# Patient Record
Sex: Female | Born: 1965 | Race: White | Hispanic: No | Smoking: Current every day smoker
Health system: Southern US, Community
[De-identification: ages and names within clinical notes are randomized; demographics above are authoritative.]

## PROBLEM LIST (undated history)

## (undated) DIAGNOSIS — F419 Anxiety disorder, unspecified: Secondary | ICD-10-CM

## (undated) DIAGNOSIS — F319 Bipolar disorder, unspecified: Secondary | ICD-10-CM

## (undated) DIAGNOSIS — M1990 Unspecified osteoarthritis, unspecified site: Secondary | ICD-10-CM

## (undated) DIAGNOSIS — C801 Malignant (primary) neoplasm, unspecified: Secondary | ICD-10-CM

## (undated) DIAGNOSIS — F603 Borderline personality disorder: Secondary | ICD-10-CM

## (undated) HISTORY — PX: OVARIAN CYST REMOVAL: SHX89

## (undated) HISTORY — PX: TONSILLECTOMY: SUR1361

## (undated) HISTORY — PX: ABDOMINAL HYSTERECTOMY: SHX81

---

## 1998-04-21 ENCOUNTER — Ambulatory Visit (HOSPITAL_BASED_OUTPATIENT_CLINIC_OR_DEPARTMENT_OTHER): Admission: RE | Admit: 1998-04-21 | Discharge: 1998-04-21 | Payer: Self-pay | Admitting: Dentistry

## 2000-06-09 ENCOUNTER — Emergency Department (HOSPITAL_COMMUNITY): Admission: EM | Admit: 2000-06-09 | Discharge: 2000-06-09 | Payer: Self-pay | Admitting: Emergency Medicine

## 2000-06-09 ENCOUNTER — Encounter: Payer: Self-pay | Admitting: Emergency Medicine

## 2000-10-30 ENCOUNTER — Other Ambulatory Visit: Admission: RE | Admit: 2000-10-30 | Discharge: 2000-10-30 | Payer: Self-pay | Admitting: Obstetrics and Gynecology

## 2002-04-18 ENCOUNTER — Inpatient Hospital Stay (HOSPITAL_COMMUNITY): Admission: EM | Admit: 2002-04-18 | Discharge: 2002-04-22 | Payer: Self-pay | Admitting: Psychiatry

## 2002-12-31 ENCOUNTER — Inpatient Hospital Stay (HOSPITAL_COMMUNITY): Admission: EM | Admit: 2002-12-31 | Discharge: 2003-01-04 | Payer: Self-pay | Admitting: Psychiatry

## 2003-01-04 ENCOUNTER — Encounter (HOSPITAL_COMMUNITY): Payer: Self-pay | Admitting: Psychiatry

## 2003-02-10 ENCOUNTER — Emergency Department (HOSPITAL_COMMUNITY): Admission: EM | Admit: 2003-02-10 | Discharge: 2003-02-10 | Payer: Self-pay | Admitting: Emergency Medicine

## 2003-02-19 ENCOUNTER — Encounter: Payer: Self-pay | Admitting: Orthopedic Surgery

## 2003-03-12 ENCOUNTER — Encounter: Payer: Self-pay | Admitting: Orthopedic Surgery

## 2003-04-25 ENCOUNTER — Emergency Department (HOSPITAL_COMMUNITY): Admission: EM | Admit: 2003-04-25 | Discharge: 2003-04-25 | Payer: Self-pay | Admitting: Emergency Medicine

## 2003-08-04 ENCOUNTER — Emergency Department (HOSPITAL_COMMUNITY): Admission: EM | Admit: 2003-08-04 | Discharge: 2003-08-04 | Payer: Self-pay | Admitting: Emergency Medicine

## 2003-09-26 ENCOUNTER — Inpatient Hospital Stay (HOSPITAL_COMMUNITY): Admission: EM | Admit: 2003-09-26 | Discharge: 2003-09-28 | Payer: Self-pay | Admitting: Psychiatry

## 2003-09-26 ENCOUNTER — Ambulatory Visit: Payer: Self-pay | Admitting: Psychiatry

## 2004-04-05 ENCOUNTER — Emergency Department (HOSPITAL_COMMUNITY): Admission: EM | Admit: 2004-04-05 | Discharge: 2004-04-05 | Payer: Self-pay | Admitting: Emergency Medicine

## 2004-04-20 ENCOUNTER — Ambulatory Visit: Payer: Self-pay | Admitting: Psychiatry

## 2004-06-04 ENCOUNTER — Emergency Department (HOSPITAL_COMMUNITY): Admission: EM | Admit: 2004-06-04 | Discharge: 2004-06-04 | Payer: Self-pay | Admitting: Emergency Medicine

## 2004-08-07 ENCOUNTER — Emergency Department (HOSPITAL_COMMUNITY): Admission: EM | Admit: 2004-08-07 | Discharge: 2004-08-07 | Payer: Self-pay | Admitting: Emergency Medicine

## 2004-09-03 ENCOUNTER — Emergency Department (HOSPITAL_COMMUNITY): Admission: EM | Admit: 2004-09-03 | Discharge: 2004-09-03 | Payer: Self-pay | Admitting: Emergency Medicine

## 2004-10-01 ENCOUNTER — Emergency Department (HOSPITAL_COMMUNITY): Admission: EM | Admit: 2004-10-01 | Discharge: 2004-10-01 | Payer: Self-pay | Admitting: Emergency Medicine

## 2005-01-07 ENCOUNTER — Emergency Department (HOSPITAL_COMMUNITY): Admission: EM | Admit: 2005-01-07 | Discharge: 2005-01-07 | Payer: Self-pay | Admitting: *Deleted

## 2005-01-16 ENCOUNTER — Emergency Department (HOSPITAL_COMMUNITY): Admission: EM | Admit: 2005-01-16 | Discharge: 2005-01-16 | Payer: Self-pay | Admitting: Emergency Medicine

## 2005-04-01 ENCOUNTER — Emergency Department (HOSPITAL_COMMUNITY): Admission: EM | Admit: 2005-04-01 | Discharge: 2005-04-01 | Payer: Self-pay | Admitting: Emergency Medicine

## 2005-05-22 ENCOUNTER — Emergency Department (HOSPITAL_COMMUNITY): Admission: EM | Admit: 2005-05-22 | Discharge: 2005-05-22 | Payer: Self-pay | Admitting: Emergency Medicine

## 2005-05-27 ENCOUNTER — Emergency Department (HOSPITAL_COMMUNITY): Admission: EM | Admit: 2005-05-27 | Discharge: 2005-05-27 | Payer: Self-pay | Admitting: Emergency Medicine

## 2005-08-24 ENCOUNTER — Emergency Department (HOSPITAL_COMMUNITY): Admission: EM | Admit: 2005-08-24 | Discharge: 2005-08-24 | Payer: Self-pay | Admitting: Emergency Medicine

## 2005-11-25 ENCOUNTER — Emergency Department (HOSPITAL_COMMUNITY): Admission: EM | Admit: 2005-11-25 | Discharge: 2005-11-25 | Payer: Self-pay | Admitting: Emergency Medicine

## 2005-12-25 ENCOUNTER — Emergency Department (HOSPITAL_COMMUNITY): Admission: EM | Admit: 2005-12-25 | Discharge: 2005-12-25 | Payer: Self-pay | Admitting: Emergency Medicine

## 2006-06-07 ENCOUNTER — Emergency Department (HOSPITAL_COMMUNITY): Admission: EM | Admit: 2006-06-07 | Discharge: 2006-06-07 | Payer: Self-pay | Admitting: Emergency Medicine

## 2006-07-03 ENCOUNTER — Emergency Department (HOSPITAL_COMMUNITY): Admission: EM | Admit: 2006-07-03 | Discharge: 2006-07-03 | Payer: Self-pay | Admitting: Emergency Medicine

## 2006-07-22 ENCOUNTER — Emergency Department (HOSPITAL_COMMUNITY): Admission: EM | Admit: 2006-07-22 | Discharge: 2006-07-22 | Payer: Self-pay | Admitting: Emergency Medicine

## 2006-09-04 ENCOUNTER — Inpatient Hospital Stay (HOSPITAL_COMMUNITY): Admission: EM | Admit: 2006-09-04 | Discharge: 2006-09-04 | Payer: Self-pay | Admitting: Emergency Medicine

## 2006-12-09 ENCOUNTER — Emergency Department (HOSPITAL_COMMUNITY): Admission: EM | Admit: 2006-12-09 | Discharge: 2006-12-09 | Payer: Self-pay | Admitting: Emergency Medicine

## 2006-12-24 ENCOUNTER — Emergency Department (HOSPITAL_COMMUNITY): Admission: EM | Admit: 2006-12-24 | Discharge: 2006-12-24 | Payer: Self-pay | Admitting: Emergency Medicine

## 2006-12-30 ENCOUNTER — Emergency Department (HOSPITAL_COMMUNITY): Admission: EM | Admit: 2006-12-30 | Discharge: 2006-12-30 | Payer: Self-pay | Admitting: Emergency Medicine

## 2007-08-11 ENCOUNTER — Emergency Department (HOSPITAL_COMMUNITY): Admission: EM | Admit: 2007-08-11 | Discharge: 2007-08-11 | Payer: Self-pay | Admitting: Emergency Medicine

## 2007-08-12 ENCOUNTER — Other Ambulatory Visit: Payer: Self-pay

## 2007-08-12 ENCOUNTER — Other Ambulatory Visit: Payer: Self-pay | Admitting: Emergency Medicine

## 2007-08-12 ENCOUNTER — Ambulatory Visit: Payer: Self-pay | Admitting: Psychiatry

## 2007-08-13 ENCOUNTER — Inpatient Hospital Stay (HOSPITAL_COMMUNITY): Admission: RE | Admit: 2007-08-13 | Discharge: 2007-08-16 | Payer: Self-pay | Admitting: Psychiatry

## 2007-09-22 ENCOUNTER — Emergency Department (HOSPITAL_COMMUNITY): Admission: EM | Admit: 2007-09-22 | Discharge: 2007-09-22 | Payer: Self-pay | Admitting: Emergency Medicine

## 2009-02-22 ENCOUNTER — Emergency Department (HOSPITAL_COMMUNITY): Admission: EM | Admit: 2009-02-22 | Discharge: 2009-02-22 | Payer: Self-pay | Admitting: Emergency Medicine

## 2009-04-19 ENCOUNTER — Encounter: Payer: Self-pay | Admitting: Orthopedic Surgery

## 2009-04-20 ENCOUNTER — Ambulatory Visit: Payer: Self-pay | Admitting: Orthopedic Surgery

## 2009-04-20 DIAGNOSIS — S335XXA Sprain of ligaments of lumbar spine, initial encounter: Secondary | ICD-10-CM

## 2009-04-20 DIAGNOSIS — M545 Low back pain: Secondary | ICD-10-CM

## 2009-05-04 ENCOUNTER — Telehealth: Payer: Self-pay | Admitting: Orthopedic Surgery

## 2009-06-16 ENCOUNTER — Other Ambulatory Visit: Payer: Self-pay

## 2009-06-16 ENCOUNTER — Ambulatory Visit: Payer: Self-pay | Admitting: Psychiatry

## 2009-06-16 ENCOUNTER — Other Ambulatory Visit: Payer: Self-pay | Admitting: Emergency Medicine

## 2009-06-16 ENCOUNTER — Inpatient Hospital Stay (HOSPITAL_COMMUNITY): Admission: AD | Admit: 2009-06-16 | Discharge: 2009-06-21 | Payer: Self-pay | Admitting: Psychiatry

## 2009-09-29 ENCOUNTER — Emergency Department (HOSPITAL_COMMUNITY): Admission: EM | Admit: 2009-09-29 | Discharge: 2009-09-29 | Payer: Self-pay | Admitting: Emergency Medicine

## 2009-10-01 ENCOUNTER — Emergency Department (HOSPITAL_COMMUNITY): Admission: EM | Admit: 2009-10-01 | Discharge: 2009-10-01 | Payer: Self-pay | Admitting: Emergency Medicine

## 2009-10-20 ENCOUNTER — Emergency Department (HOSPITAL_COMMUNITY): Admission: EM | Admit: 2009-10-20 | Discharge: 2009-10-20 | Payer: Self-pay | Admitting: Emergency Medicine

## 2010-02-16 NOTE — Progress Notes (Signed)
Summary: what is location of arthritis  Phone Note Call from Patient   Summary of Call: Sandra Santos ([birth year]) seen here 2 weeks ago for back pain.  Wants to know exactly where the arthritis is locted in her back. Her # [phone] Initial call taken by: Jacklynn Ganong,  May 04, 2009 2:26 PM  Follow-up for Phone Call        lower back  Follow-up by: Fuller Canada MD,  May 05, 2009 8:36 AM  Additional Follow-up for Phone Call Additional follow up Details #1::        Left a message for the patient to call our office. Additional Follow-up by: Jacklynn Ganong,  May 05, 2009 10:39 AM    Additional Follow-up for Phone Call Additional follow up Details #2::    Tried to call patient a second time and got the message that the telephone number has been temporarily disconnected

## 2010-02-16 NOTE — Progress Notes (Signed)
Summary: Initial Eval  02/19/03  Initial Eval  02/19/03   Imported By: Cammie Sickle 04/06/2009 11:11:52  _____________________________________________________________________  External Attachment:    Type:   Image     Comment:   External Document

## 2010-02-16 NOTE — Progress Notes (Signed)
Summary: Office Visit 03/12/03  Office Visit 03/12/03   Imported By: Cammie Sickle 04/06/2009 11:57:28  _____________________________________________________________________  External Attachment:    Type:   Image     Comment:   External Document

## 2010-02-16 NOTE — Miscellaneous (Signed)
Summary: Phys therapy order [facility]  Phys therapy order [facility]   Imported By: Cammie Sickle 07/09/2009 13:27:28  _____________________________________________________________________  External Attachment:    Type:   Image     Comment:   External Document

## 2010-02-16 NOTE — Assessment & Plan Note (Signed)
Summary: LOW BACK PAIN,RADIAT'G TO FRONT/NEED XRAYS/[place]   Vital Signs:  Patient profile:   45 year old female Height:      63 inches Weight:      161 pounds Pulse rate:   74 / minute Resp:     16 per minute  Vitals Entered By: Fuller Canada MD (April 20, 2009 9:58 AM)  Visit Type:  Initial Consult Referring Provider:  Dr. Olena Leatherwood Primary Provider:  Dr. Olena Leatherwood  CC:  low back pain.  History of Present Illness: This is a 45 year old female who presents with back pain x2 months which started after she was lifting firewood.  She she took some ibuprofen and Flexeril but didn't get any better.  Chest throbbing stabbing pain which is 6/10 constant worse with movement associated with some tingling and radiation of pain into the LEFT knee.   Red Flags: Denies  numbness; bowel /bladder problems, fever night sweats     Meds: Lithium, Abilify, Trazadone.    Allergies (verified): No Known Drug Allergies  Past History:  Past Medical History: bipolar depression anxiety  Past Surgical History: none  Family History: FH of Cancer:  Family History of Diabetes Family History of Arthritis Hx, family, chronic respiratory condition Hx, family, asthma  Social History: Patient is separated.  unemployed smokes 1 ppd cigs drinks twice a month caffeine use daily  Review of Systems Constitutional:  Complains of weight gain. Neurologic:  Complains of tremors. Musculoskeletal:  Complains of muscle pain. Psychiatric:  Complains of nervousness and depression. Immunology:  Complains of seasonal allergies.  Physical Exam  Additional Exam:  GEN: well developed, well nourished, normal grooming and hygiene, no deformity and normal body habitus.   CDV: pulses are normal, no edema, no erythema. no tenderness  Lymph: normal lymph nodes   Skin: no rashes, skin lesions or open sores   NEURO: normal coordination, reflexes, sensation.   Psyche: awake, alert and  oriented. Mood normal   Gait: Slightly abnormal with an altered gait secondary to back spasms  Inspection, tender LEFT gluteal region LEFT lumbar spine.  No thoracic tenderness.  LOWER EXTREMS: Normal alignment and no atrophy, subluxation or tremor or contracture   Straight leg raises produce back pain no leg pain   Impression & Recommendations:  Problem # 1:  LUMBAR STRAIN (ICD-847.2) Assessment New  Orders: New Patient Level III (84696)  I ordered 3 views of the lumbar spine there is some facet arthritis but otherwise the spine looks normal impression normal spine  Patient is to start Robaxin 500 mg q.6 #42 refills and naproxen 500 mg one p.o. b.i.d. #60 one refill.  We also ordered physical therapy.  Other Orders: Physical Therapy Referral (PT)  Patient Instructions: 1)  PT  2)  Take new medications  3)  f/u as needed   Appended Document: LOW BACK PAIN,RADIAT'G TO FRONT/NEED XRAYS/[place] the patient's back pain is not chronic correction

## 2010-02-16 NOTE — Letter (Signed)
Summary: Historic Patient File  Historic Patient File   Imported By: Elvera Maria 04/22/2009 10:10:10  _____________________________________________________________________  External Attachment:    Type:   Image     Comment:   history form

## 2010-02-21 ENCOUNTER — Emergency Department (HOSPITAL_COMMUNITY)
Admission: EM | Admit: 2010-02-21 | Discharge: 2010-02-21 | Disposition: A | Discharge status: Home / Self Care | Payer: Medicaid Other | Attending: Emergency Medicine | Admitting: Emergency Medicine

## 2010-02-21 DIAGNOSIS — IMO0001 Reserved for inherently not codable concepts without codable children: Secondary | ICD-10-CM | POA: Insufficient documentation

## 2010-02-21 DIAGNOSIS — R509 Fever, unspecified: Secondary | ICD-10-CM | POA: Insufficient documentation

## 2010-02-21 DIAGNOSIS — J3489 Other specified disorders of nose and nasal sinuses: Secondary | ICD-10-CM | POA: Insufficient documentation

## 2010-02-21 DIAGNOSIS — R059 Cough, unspecified: Secondary | ICD-10-CM | POA: Insufficient documentation

## 2010-02-21 DIAGNOSIS — R05 Cough: Secondary | ICD-10-CM | POA: Insufficient documentation

## 2010-02-21 DIAGNOSIS — F3130 Bipolar disorder, current episode depressed, mild or moderate severity, unspecified: Secondary | ICD-10-CM | POA: Insufficient documentation

## 2010-02-21 DIAGNOSIS — Z79899 Other long term (current) drug therapy: Secondary | ICD-10-CM | POA: Insufficient documentation

## 2010-02-21 DIAGNOSIS — J329 Chronic sinusitis, unspecified: Secondary | ICD-10-CM | POA: Insufficient documentation

## 2010-04-03 LAB — LIPID PANEL
HDL: 46 mg/dL (ref >39)
LDL Cholesterol: 72 mg/dL (ref 0–99)
Triglycerides: 289 mg/dL — ABNORMAL HIGH (ref <150)
VLDL: 58 mg/dL — ABNORMAL HIGH (ref 0–40)

## 2010-04-03 LAB — DIFFERENTIAL
Basophils Relative: 1 % (ref 0–1)
Eosinophils Absolute: 0.2 10*3/uL (ref 0.0–0.7)
Eosinophils Relative: 2 % (ref 0–5)
Lymphocytes Relative: 35 % (ref 12–46)
Lymphs Abs: 3.4 10*3/uL (ref 0.7–4.0)
Monocytes Relative: 7 % (ref 3–12)
Neutro Abs: 5.3 10*3/uL (ref 1.7–7.7)

## 2010-04-03 LAB — BASIC METABOLIC PANEL
BUN: 6 mg/dL (ref 6–23)
CO2: 19 mEq/L (ref 19–32)
Calcium: 9.2 mg/dL (ref 8.4–10.5)
GFR calc non Af Amer: >60 mL/min (ref >60)
Sodium: 138 mEq/L (ref 135–145)

## 2010-04-03 LAB — RAPID URINE DRUG SCREEN, HOSP PERFORMED
Amphetamines: NOT DETECTED
Barbiturates: NOT DETECTED
Opiates: POSITIVE — AB
Tetrahydrocannabinol: NOT DETECTED

## 2010-04-03 LAB — CBC
Hemoglobin: 15.5 g/dL — ABNORMAL HIGH (ref 12.0–15.0)
MCHC: 33.9 g/dL (ref 30.0–36.0)
WBC: 9.6 10*3/uL (ref 4.0–10.5)

## 2010-04-03 LAB — RPR: RPR Ser Ql: NONREACTIVE

## 2010-04-03 LAB — ETHANOL: Alcohol, Ethyl (B): 174 mg/dL — ABNORMAL HIGH (ref 0–10)

## 2010-04-03 LAB — TSH: TSH: 3.698 u[IU]/mL (ref 0.350–4.500)

## 2010-04-03 LAB — LITHIUM LEVEL: Lithium Lvl: 0.53 mEq/L — ABNORMAL LOW (ref 0.80–1.40)

## 2010-04-10 ENCOUNTER — Emergency Department (HOSPITAL_COMMUNITY)
Admission: EM | Admit: 2010-04-10 | Discharge: 2010-04-10 | Disposition: A | Discharge status: Home / Self Care | Payer: Medicaid Other | Attending: Emergency Medicine | Admitting: Emergency Medicine

## 2010-04-10 DIAGNOSIS — R630 Anorexia: Secondary | ICD-10-CM | POA: Insufficient documentation

## 2010-04-10 DIAGNOSIS — J029 Acute pharyngitis, unspecified: Secondary | ICD-10-CM | POA: Insufficient documentation

## 2010-04-10 LAB — RAPID STREP SCREEN (MED CTR MEBANE ONLY): Streptococcus, Group A Screen (Direct): NEGATIVE

## 2010-05-12 ENCOUNTER — Emergency Department (HOSPITAL_COMMUNITY)
Admission: EM | Admit: 2010-05-12 | Discharge: 2010-05-12 | Disposition: A | Discharge status: Home / Self Care | Payer: Medicaid Other | Attending: Emergency Medicine | Admitting: Emergency Medicine

## 2010-05-12 DIAGNOSIS — L03211 Cellulitis of face: Secondary | ICD-10-CM | POA: Insufficient documentation

## 2010-05-12 DIAGNOSIS — H109 Unspecified conjunctivitis: Secondary | ICD-10-CM | POA: Insufficient documentation

## 2010-05-12 DIAGNOSIS — L0201 Cutaneous abscess of face: Secondary | ICD-10-CM | POA: Insufficient documentation

## 2010-05-12 DIAGNOSIS — IMO0002 Reserved for concepts with insufficient information to code with codable children: Secondary | ICD-10-CM | POA: Insufficient documentation

## 2010-05-30 NOTE — H&P (Signed)
NAMEHEATHERLY, Sandra Santos                ACCOUNT NO.:  [id number]   MEDICAL RECORD NO.:  [id number]          PATIENT TYPE:  INP   LOCATION:  IC06                          FACILITY:  [facility]   PHYSICIAN:  Marcello Moores, MD   DATE OF BIRTH:  09-22-1965   DATE OF ADMISSION:  09/03/2006  DATE OF DISCHARGE:  LH                              HISTORY & PHYSICAL   PRIMARY MEDICAL DOCTOR:  Unassigned.   CHIEF COMPLAINT:  Brought by EMS for drug overdose.   HISTORY OF PRESENT ILLNESS:  Sandra Santos is a 45 year old female patient  with history of psychiatric problems, bipolar disorder, depression and  anxiety and post-traumatic stress syndrome brought by EMS for the above  reasons.  The patient is drowsy and sleepy and cannot communicate, but  as per the EMS report and ER physician report, the patient was partying  and she was drinking alcohol and on top of that she took multiple drugs  just for the sake of enjoyment and stated that she took Xanax, Trazodone  and others unspecified.  She was brought to the emergency room, and she  was very agitated and aggressive in the emergency room.  By EMS, Narcan  as well as charcoal was given.  Currently, the patient is sleepy,.  otherwise she is breathing well and her vital signs are very stable.   REVIEW OF SYSTEMS:  A 10-point review of system is limited as the  patient cannot give any history.   ALLERGIES:  Not known.   PAST MEDICAL HISTORY:  1. Bipolar disorder.  2. Depression.  3. Post-traumatic stress disorder.  4. Chronic smoker.   HOME MEDICATIONS:  1. Lithium carbonate 900 mg p.o. once a day.  2. Trazodone 100 mg at bedtime.  3. Lexapro, unknown dose   SOCIAL HISTORY:  She is an active smoker and heavy drinker, but does not  know when she used drugs or not.   PHYSICAL EXAMINATION:  GENERAL:  The patient is sleepy, lying in the  bed, stable, breathing well by herself.  VITAL SIGNS: Temperature 98.5, blood pressure 118 x 65, pulse rate is  64, respiratory rate 20 and saturation is 100% on room air.  HEENT: She has pink conjunctivae.  Nonicteric sclera.  Pupils are equal  and reactive on both sides.  NECK:  Supple.  CHEST:  She has good air entry bilaterally.  CVS: S1 is regular.  No murmur.  ABDOMEN:  Soft.  No area of tenderness.  EXTREMITIES:  No pedal edema.  CNS: She is sleepy, but can be awakened by the painful stimuli, and  there is no neurological deficit.   LABORATORY DATA:  White blood cells 8.6 and hemoglobin is 13.5,  hematocrit is 39.7 and platelets is 290.  ABG was done on room air, pH  is 7.4, pCO2 is 32, pO2 is 107, bicarb is 19.7 and saturation is 98%.  Chemistry:  Sodium is 140, potassium 3.7, chloride is 113, bicarb 26,  glucose is 85, BUN 9, creatinine is 0.7, calcium is 8.27.  Alcohol level  is 122.  Urine screen for drugs was  also done, and it was positive for  benzodiazepines and opiates.   ASSESSMENT:  1. Drug overdose.  Even though we do not know all of them, it was as      stated that she took Xanax and trazodone as well, and others      nonspecified, but the patient, except she is sleepy, she is      currently stable, breathing by herself, regular and blood pressure      is maintained within normal range.  So we will admit her to ICU and      will monitor her, and depending on her clinical situation, also      will manage her accordingly.  Otherwise, currently the patient is      very stable, and will give her oxygen also via her nasal cannula.      But, her situation might be changed at any time and she needs close      monitoring in ICU.  2. Alcohol intoxication.  She was drinking and her alcohol level is      122.  When she is clear, we might need to put her on alcohol      withdrawal protocol.  3. Psychiatric problem.  Currently, we are not sure whether the      patient took it for suicidal attempt or not, but it looks more for      fun rather than for suicidal attempt, so tomorrow  active team will      evaluate her as well and they will put her on one-to-one      observation.  The person at department is already notified from the      emergency room, and if they have any recommendations we will follow      these recommendations.  Otherwise, the patient will be receiving IV      fluid and close monitoring.      Marcello Moores, MD  Electronically Signed     MT/MEDQ  D:  09/04/2006  T:  09/04/2006  Job:  [id number]

## 2010-05-30 NOTE — Discharge Summary (Signed)
NAMENATALINA, WIETING NO.:  [id number]   MEDICAL RECORD NO.:  [id number]          PATIENT TYPE:  IPS   LOCATION:  0405                          FACILITY:  [facility]   PHYSICIAN:  Geoffery Lyons, M.D.      DATE OF BIRTH:  Dec 13, 1965   DATE OF ADMISSION:  08/13/2007  DATE OF DISCHARGE:  08/16/2007                               DISCHARGE SUMMARY   CHIEF COMPLAINT/PRESENT ILLNESS:  This was the first admission to [facility] for this 45 year old separated female.  Petition  papers stated that she has a history of bipolar disorder and that she  was suicidal, was saying that she was going to walk into the street to  get hit by a car.  She on the other hand said that she is in the unit to  get help for her benzodiazepine abuse.  She claimed that when she gets  upset, she uses Xanax, more recently took 5 the day before the  admission, claimed that her boyfriend wanted her to get some help.  She  claimed that she when she went to the emergency room, she was not  treated well.  She wanted to leave was looking for her boyfriend, stated  that she got near traffic and that she was petitioned.  She endorsed she  had been hearing some conversation in her head but was not sure if they  were voices or not.  She denies any other substance use.   PAST PSYCHIATRIC HISTORY:  Hospitalized in 2000 for suicide attempt, had  been diagnosed with bipolar disorder, attending [facility].   ALCOHOL AND DRUG HISTORY:  As already stated, admits to abusing  benzodiazepines, Xanax, no other substances.   MEDICAL HISTORY:  Noncontributory.   MEDICATIONS:  She had been on lithium, trazodone and Lamictal, but she  had been off medications for a year.   PHYSICAL EXAMINATION:  Failed to show any acute findings.   LABORATORY WORKUP:  CBC within normal limits.  BMET:  Potassium 3.4.  Urinalysis revealed 21-50 white blood cells.  UDS positive for  benzodiazepine, cocaine and opiates.   MENTAL STATUS EXAMINATION:  Revealed a fully alert cooperative female,  good eye contact, disheveled.  Speech is clear, normal rate, tempo and  production, not spontaneous.  She does not offer much information other  than what she is asked for.  Mood is depressed.  Affect is broad.  Thought processes logical, coherent and relevant.  Minimizes her  substance use and no delusions.  No active suicidal or homicidal ideas,  no hallucinations.  Cognition is well preserved.   DIAGNOSES:  AXIS I:  Bipolar disorder by history, benzodiazepine,  cocaine, opiate abuse.  AXIS II:  No diagnosis.  AXIS III:  Urinary tract infection.  AXIS IV:  Moderate.  AXIS V:  Upon admission 38, highest in the last year 60.   COURSE IN THE HOSPITAL:  She was admitted, started individual and group  psychotherapy.  We started detoxing with Librium.  Eventually, she was  placed back on lithium as well as  Celexa.  As already stated, she was  initially admitted due to her abuse of benzodiazepines, claimed that she  self-medicated with nonprescribed Xanax, claims she has an anger  problem.  She endorsed that the lithium helped her, did not feel that  the  Lamictal did, endorsed anxiety and depression.  We worked with the  Celexa and the lithium.  On August 15, 2007, she was nervous but slept  well.  She endorsed no suicidal or homicidal ideas.  Overall, she felt  better, and on August 16, 2007, she was in full contact with reality.  There were no active suicidal or homicidal ideas, no hallucinations or  delusions.  She was willing and motivated to pursue further outpatient  treatment.  She requested discharge.  Discharge was granted.   DISCHARGE DIAGNOSES:  AXIS I:  Cocaine, opiate, benzodiazepine abuse,  mood disorder not otherwise specified.  AXIS II:  No diagnosis.  AXIS III:  Urinary tract infection.  AXIS IV:  Moderate.  AXIS V:  Upon discharge 50.   MEDICATIONS:  1.  Lithium 300 two in the morning and two at night.  2. Macrobid 100 mg twice a day for five more days.  3. Celexa 20 mg per day.  4. Trazodone 100 at bedtime for sleep.  5. Ambien 10 mg at night for sleep.   Lithium level upon discharge was 0.91.  Follow up at [facility].      Geoffery Lyons, M.D.  Electronically Signed     IL/MEDQ  D:  09/16/2007  T:  09/16/2007  Job:  [id number]

## 2010-05-30 NOTE — Discharge Summary (Signed)
NAMECHAUNTAE, HULTS                ACCOUNT NO.:  [id number]   MEDICAL RECORD NO.:  [id number]          PATIENT TYPE:  INP   LOCATION:  IC06                          FACILITY:  [facility]   PHYSICIAN:  Osvaldo Shipper, MD     DATE OF BIRTH:  [birth year]   DATE OF ADMISSION:  09/03/2006  DATE OF DISCHARGE:  08/20/2008LH                               DISCHARGE SUMMARY   Please review H&P dictated by Dr. Benson Setting for details regarding the  patient's presenting illness.   The patient does not does not have a PMD.   DISCHARGE DIAGNOSIS:  1. Drug overdose, likely secondary to recreational use.  2. Alcohol intoxication.  3. History of bipolar disorder, depression, an posttraumatic stress      disorder -- all stable.   BRIEF HOSPITAL COURSE:  Briefly this is a 45 year old Caucasian female  who presented, actually was brought in by EMS, after she was found to be  a little bit unresponsive.  The patient was drowsy and sleepy in the ED.  She apparently took a Xanax, trazodone, and maybe other medications.  She was brought into the ED where she was agitated.  She was given  Narcan and charcoal.  She was admitted to the ICU for intensive  monitoring.  The patient subsequently woke up her during the course of  the night; and in the morning was very agitated and wanted to leave the  hospital.  When I came to evaluate her, she was awake, alert, and  oriented x3.  Denied any suicidal intent or attempt.  She said that she  was intoxicated and probably by mistake took all of these medications.  Her urine drug screen was positive for benzodiazepines and opiates;  negative for cocaine and marijuana.  Alcohol level was 122.  Her other  blood work was all completely unremarkable.  I had the patient seen by  the ACT team who also felt that this was not likely a suicidal attempt.  In view of the above, it was felt that the patient was stable enough to  be discharged home.  Hence, the patient was subsequently  discharged.   DISCHARGE MEDICATIONS:  She is apparently on lithium which she has not  taken along while at home. She is also on trazodone and Lexapro.  I have  asked her to follow up with her psychiatrist to obtain prescriptions for  the above.  Since she has not been on lithium for more than 3-4 weeks.  We did not check a lithium level.   FOLLOWUP:  With her psychiatrist.  She was counseled not to consume  heavy amounts of alcohol.   DIET:  No restrictions.   PHYSICAL ACTIVITY:  No restrictions.   CONSULTATION:  Behavioral health.   35 minutes were spent on discharging this patient.      Osvaldo Shipper, MD  Electronically Signed     GK/MEDQ  D:  09/04/2006  T:  09/05/2006  Job:  [id number]

## 2010-05-30 NOTE — H&P (Signed)
NAMEKAYLIEE, ATIENZA NO.:  [id number]   MEDICAL RECORD NO.:  [id number]          PATIENT TYPE:  EMS   LOCATION:  ED                            FACILITY:  [facility]   PHYSICIAN:  Anselm Jungling, MD  DATE OF BIRTH:  Nov 27, 1965   DATE OF ADMISSION:  08/12/2007  DATE OF DISCHARGE:  08/12/2007                       PSYCHIATRIC ADMISSION ASSESSMENT   HISTORY OF PRESENT ILLNESS:  This is a 45 year old separated female  admitted on August 12, 2007.  The patient is here on petition.  Petition  papers state the patient has a history of bipolar disorder, and today  she is suicidal to walk into a street to get hit by a car.  The patient  states that she is here to get help with her benzodiazepine abuse.  She  states when she gets upset she uses Xanax, used Xanax recently, took  five yesterday on the day of admission.  She states her boyfriend wanted  her to get help.  States that when she went to the emergency room that  she was not treated well, wanted to leave and was looking for her  boyfriend and states that she got near traffic.  She states that she  again was then petitioned to come to get help.  She states she has been  hearing some conversations in her head, but she is not sure if it is  voices or not.  She has been eating okay.  She denies any other  substance use.  Has been off her medications for some time.  Is also  under significant stress, stating that she is homeless and has little  social support.   PAST PSYCHIATRIC HISTORY:  The patient was hospitalized in 2000 for  suicide attempt.  This is her first admission here.  No current  outpatient mental health care.  History of bipolar disorder, was  attending [facility].   SOCIAL HISTORY:  A 45 year old, separated female, separated 12 years,  has three children, two grandchildren.  She considers herself homeless.  Receives SSI.   FAMILY HISTORY:  None.   ALCOHOL AND DRUG HABITS:  The  patient smokes a pack daily.  Again  reported some recent benzodiazepine abuse.   PRIMARY CARE Daysie Helf:  None.   MEDICAL PROBLEMS:  No known medical conditions.   MEDICATIONS:  In the past has been on lithium, trazodone, Lamictal.  She  states her lithium is very effective for her, but again is not on any  psychotropic medications for least one year.   DRUG ALLERGIES:  No known allergies.   PHYSICAL EXAMINATION:  GENERAL:  This is a middle-aged female who was  fully assessed at [facility].  Her physical exam was reviewed.  This is a somewhat disheveled middle-aged female.  No acute distress.  VITAL SIGNS:  Temperature 97.7, 61 heart rate, 18 respirations, blood  pressure is 105/70, 5 feet 2 inches tall, 134 pounds, O2 sat is 100%.   LABORATORY DATA:  Shows a CBC within normal limits.  Her B-met shows  potassium of 3.4.  Urinalysis is 21-50 WBCs.  Urine drug screen is  positive for benzodiazepines, positive for cocaine, positive for  opiates.   MENTAL STATUS EXAM:  She is fully alert, cooperative, poor eye contact  and somewhat disheveled.  Speech is clear.  Offers little information  other than questions that are asked.  Mood is neutral.  The patient's  affect is flat.  Thought processes are coherent.  No evidence of any  delusional thinking.  Does not appear to be responding to internal  stimuli.  Cognitive function intact.  Memory is good.  Judgment and  insight are fair.   DIAGNOSES:  AXIS I:  Bipolar disorder per patient.  Benzodiazepine  abuse.  AXIS II:  Deferred.  AXIS III:  Current urinary tract infection diagnosed in the emergency  department.  AXIS IV:  Problems with housing, other psychosocial problems.  AXIS V:  Current is 30-35.   PLAN:  We will initiate the patient on Librium protocol to detox off of  benzodiazepines.  Will have Macrobid for 7 days to treat her urinary  tract infection that was discussed with the patient.  We will initiate  the  patient's lithium as her kidney function looks adequate and the  patient reports good response in the past.  Will check her liver  function.  Case manager will assess her living situation and will  continue to identify supports and gather more history.  Her tentative  length of stay at this time is 3-5 days.  The patient is a dual  diagnosis and will address her substance abuse.      Landry Corporal, N.P.      Anselm Jungling, MD  Electronically Signed    JO/MEDQ  D:  08/14/2007  T:  08/14/2007  Job:  [id number]
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
## 2010-06-02 NOTE — Discharge Summary (Signed)
NAME:  Sandra Santos, Sandra Santos                          ACCOUNT NO.:  [id number]   MEDICAL RECORD NO.:  [id number]                   PATIENT TYPE:  IPS   LOCATION:  0501                                 FACILITY:  [facility]   PHYSICIAN:  Geoffery Lyons, M.D.                   DATE OF BIRTH:  [birth year]   DATE OF ADMISSION:  12/31/2002  DATE OF DISCHARGE:  01/04/2003                                 DISCHARGE SUMMARY   CHIEF COMPLAINT AND PRESENT ILLNESS:  This was the second admission to [facility] for this 45 year old divorced white female  voluntarily admitted.  She had a history of polysubstance abuse, referred by  her therapist who felt she was suicidal.  She expressed hopelessness now  that children were removed to [employer] custody last week.  She had been using  crack cocaine, clean for 12 years until a month ago.  She endorsed feeling  overwhelmed, hopeless, helpless.  She endorsed suicidal ideation.   PAST PSYCHIATRIC HISTORY:  This was the second time at [facility].  She overdosed on muscle relaxants and alcohol.  She had at least  five prior suicide attempts.   SUBSTANCE ABUSE HISTORY:  She had a history of polysubstance abuse, as  already stated.   PAST MEDICAL HISTORY:  1. Migraine headaches.  2. Genital warts.  3. Hysterectomy.   MEDICATIONS:  1. Depakote 250 mg in the morning and 500 mg at night.  2. Lamictal 50 mg in the morning and 50 mg at night.  3. Lexapro 10 mg daily.  4. Seroquel 75 mg at night.  5. Klonopin 1 mg three times a day.   PHYSICAL EXAMINATION:  Physical examination was performed, failed to show  any acute findings.   MENTAL STATUS EXAM:  Mental status exam revealed a fully alert but irritable  female, reluctant, aloof, emotionally withdrawn.  Speech: Normal production  and tempo.  Mood: Irritable, depressed.  Thought processes: Logical and  coherent; positive suicidal rumination, no homicidal ideas, no agitation, no  clear  plan.  Cognitive: Cognition was well preserved.   ADMISSION DIAGNOSES:   AXIS I:  1. Bipolar disorder.  2. Cocaine abuse.   AXIS II:  No diagnosis.   AXIS III:  Migraine headaches.   AXIS IV:  Moderate.   AXIS V:  Global assessment of functioning upon admission 34, highest global  assessment of functioning in the last year 60.   LABORATORY DATA:  Thyroid profile was within normal limits.  CT scan was  negative.   HOSPITAL COURSE:  She was maintained on the Lamictal, the Depakote, the  Lexapro, the Klonopin.  Lamictal was changed to 100 mg in the morning and 50  mg in the afternoon.  Klonopin was changed to 0.75 mg three times a day with  plans to eventually wean off.  She was given some Fioricet.  She  complained  of headache.  She was given Demerol, Phenergan, and Toradol x 1.  Depakote  was increased to 500 mg twice a day.  She continued to complain of pain,  medication-seeking.  She was given Imitrex.  She had difficulty with sleep  so she was given Seroquel.  She continued to endorse suicidal ruminations  with no plan, multiple issues, the migraines, the mood change, the  depression, the anxiety, issues with the cocaine.  Her mood, though, as the  hospitalization progressed improved.  She felt she was ready to go home.  So, on December 20, she was in full contact with reality.  There were no  suicidal ideas, no homicidal ideas, feeling more positive about her  situation.  The main concern was the headaches, better on the day of  admission.  She was going to follow up on an outpatient basis for which  discharged home.   DISCHARGE DIAGNOSES:   AXIS I:  1. Bipolar disorder.  2. Posttraumatic stress disorder.  3. Cocaine abuse.   AXIS II:  No diagnosis.   AXIS III:  Migraine headaches.   AXIS IV:  Moderate.   AXIS V:  Global assessment of functioning upon discharge 55.   DISCHARGE MEDICATIONS:  1. Lexapro 10 mg per day.  2. Lamictal 25 mg two in the morning and  100 mg at night.  3. Depakote ER 500 mg twice a day.  4. Seroquel 25 mg two at night.  5. Fioricet one to two every six hours as needed for headache.   FOLLOW UP:  She was to follow up with [facility].                                               Geoffery Lyons, M.D.    IL/MEDQ  D:  01/27/2003  T:  01/28/2003  Job:  [id number]
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
## 2010-06-02 NOTE — H&P (Signed)
NAME:  Sandra Santos, Sandra Santos                          ACCOUNT NO.:  [id number]   MEDICAL RECORD NO.:  [id number]                   PATIENT TYPE:  IPS   LOCATION:  0300                                 FACILITY:  [facility]   PHYSICIAN:  Margaret A. Scott, N.P.             DATE OF BIRTH:  1965/09/02   DATE OF ADMISSION:  09/26/2003  DATE OF DISCHARGE:                         PSYCHIATRIC ADMISSION ASSESSMENT   ADDENDUM:  Corrected note should also read, under plan of treatment.   PLAN OF TREATMENT:  The patient has been started on a Librium protocol to  detox her from the benzodiazepines and she is tolerating the protocol well.   ESTIMATED LENGTH OF STAY:  4-5 days.                                               Margaret A. Lorin Picket, N.P.    MAS/MEDQ  D:  09/28/2003  T:  09/28/2003  Job:  [id number]
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
## 2010-07-03 ENCOUNTER — Emergency Department (HOSPITAL_COMMUNITY)
Admission: EM | Admit: 2010-07-03 | Discharge: 2010-07-03 | Disposition: A | Discharge status: Home / Self Care | Payer: Medicaid Other | Attending: Emergency Medicine | Admitting: Emergency Medicine

## 2010-07-03 DIAGNOSIS — S335XXA Sprain of ligaments of lumbar spine, initial encounter: Secondary | ICD-10-CM | POA: Insufficient documentation

## 2010-07-03 DIAGNOSIS — X500XXA Overexertion from strenuous movement or load, initial encounter: Secondary | ICD-10-CM | POA: Insufficient documentation

## 2010-07-03 DIAGNOSIS — F319 Bipolar disorder, unspecified: Secondary | ICD-10-CM | POA: Insufficient documentation

## 2010-07-03 DIAGNOSIS — F17200 Nicotine dependence, unspecified, uncomplicated: Secondary | ICD-10-CM | POA: Insufficient documentation

## 2010-07-11 ENCOUNTER — Emergency Department (HOSPITAL_COMMUNITY)
Admission: EM | Admit: 2010-07-11 | Discharge: 2010-07-11 | Payer: Medicaid Other | Attending: Emergency Medicine | Admitting: Emergency Medicine

## 2010-07-11 DIAGNOSIS — F319 Bipolar disorder, unspecified: Secondary | ICD-10-CM | POA: Insufficient documentation

## 2010-07-11 DIAGNOSIS — Z79899 Other long term (current) drug therapy: Secondary | ICD-10-CM | POA: Insufficient documentation

## 2010-07-11 DIAGNOSIS — R45851 Suicidal ideations: Secondary | ICD-10-CM | POA: Insufficient documentation

## 2010-07-11 DIAGNOSIS — F4310 Post-traumatic stress disorder, unspecified: Secondary | ICD-10-CM | POA: Insufficient documentation

## 2010-07-11 DIAGNOSIS — E876 Hypokalemia: Secondary | ICD-10-CM | POA: Insufficient documentation

## 2010-07-11 DIAGNOSIS — F1210 Cannabis abuse, uncomplicated: Secondary | ICD-10-CM | POA: Insufficient documentation

## 2010-07-11 DIAGNOSIS — F411 Generalized anxiety disorder: Secondary | ICD-10-CM | POA: Insufficient documentation

## 2010-07-11 LAB — RAPID URINE DRUG SCREEN, HOSP PERFORMED
Amphetamines: NOT DETECTED
Barbiturates: NOT DETECTED
Cocaine: NOT DETECTED
Tetrahydrocannabinol: NOT DETECTED

## 2010-07-11 LAB — URINALYSIS, ROUTINE W REFLEX MICROSCOPIC
Bilirubin Urine: NEGATIVE
Glucose, UA: NEGATIVE mg/dL
Nitrite: NEGATIVE
Protein, ur: NEGATIVE mg/dL
Urobilinogen, UA: 0.2 mg/dL (ref 0.0–1.0)

## 2010-07-11 LAB — BASIC METABOLIC PANEL
CO2: 27 mEq/L (ref 19–32)
Creatinine, Ser: 0.79 mg/dL (ref 0.50–1.10)
GFR calc Af Amer: >60 mL/min (ref >60)
Glucose, Bld: 94 mg/dL (ref 70–99)
Sodium: 140 mEq/L (ref 135–145)

## 2010-07-11 LAB — DIFFERENTIAL
Basophils Absolute: 0 10*3/uL (ref 0.0–0.1)
Monocytes Absolute: 0.7 10*3/uL (ref 0.1–1.0)
Neutro Abs: 8.4 10*3/uL — ABNORMAL HIGH (ref 1.7–7.7)
Neutrophils Relative %: 66 % (ref 43–77)

## 2010-07-11 LAB — CBC
HCT: 43.9 % (ref 36.0–46.0)
Hemoglobin: 15.1 g/dL — ABNORMAL HIGH (ref 12.0–15.0)
MCH: 32.8 pg (ref 26.0–34.0)
MCV: 95.4 fL (ref 78.0–100.0)
RBC: 4.6 MIL/uL (ref 3.87–5.11)
WBC: 12.7 10*3/uL — ABNORMAL HIGH (ref 4.0–10.5)

## 2010-07-11 LAB — URINE MICROSCOPIC-ADD ON

## 2010-07-11 LAB — ETHANOL: Alcohol, Ethyl (B): 90 mg/dL — ABNORMAL HIGH (ref 0–11)

## 2010-08-03 ENCOUNTER — Emergency Department (HOSPITAL_COMMUNITY)
Admission: EM | Admit: 2010-08-03 | Discharge: 2010-08-03 | Disposition: A | Discharge status: Home / Self Care | Payer: Medicaid Other | Attending: Emergency Medicine | Admitting: Emergency Medicine

## 2010-08-03 DIAGNOSIS — F3130 Bipolar disorder, current episode depressed, mild or moderate severity, unspecified: Secondary | ICD-10-CM | POA: Insufficient documentation

## 2010-08-03 DIAGNOSIS — R109 Unspecified abdominal pain: Secondary | ICD-10-CM | POA: Insufficient documentation

## 2010-08-03 DIAGNOSIS — IMO0002 Reserved for concepts with insufficient information to code with codable children: Secondary | ICD-10-CM | POA: Insufficient documentation

## 2010-08-03 DIAGNOSIS — M545 Low back pain, unspecified: Secondary | ICD-10-CM | POA: Insufficient documentation

## 2010-08-03 DIAGNOSIS — Z79899 Other long term (current) drug therapy: Secondary | ICD-10-CM | POA: Insufficient documentation

## 2010-08-03 DIAGNOSIS — M5380 Other specified dorsopathies, site unspecified: Secondary | ICD-10-CM | POA: Insufficient documentation

## 2010-08-03 DIAGNOSIS — X500XXA Overexertion from strenuous movement or load, initial encounter: Secondary | ICD-10-CM | POA: Insufficient documentation

## 2010-08-03 DIAGNOSIS — IMO0001 Reserved for inherently not codable concepts without codable children: Secondary | ICD-10-CM | POA: Insufficient documentation

## 2010-08-03 DIAGNOSIS — G8929 Other chronic pain: Secondary | ICD-10-CM | POA: Insufficient documentation

## 2010-08-09 ENCOUNTER — Encounter: Payer: Self-pay | Admitting: *Deleted

## 2010-08-09 DIAGNOSIS — H5710 Ocular pain, unspecified eye: Secondary | ICD-10-CM | POA: Insufficient documentation

## 2010-08-09 DIAGNOSIS — Z5320 Procedure and treatment not carried out because of patient's decision for unspecified reasons: Secondary | ICD-10-CM | POA: Insufficient documentation

## 2010-08-09 NOTE — ED Notes (Signed)
Patient with rt eye pain since [place], possible sty to rt eye, yellow drainage

## 2010-08-10 ENCOUNTER — Emergency Department (HOSPITAL_COMMUNITY)
Admission: EM | Admit: 2010-08-10 | Discharge: 2010-08-10 | Disposition: A | Discharge status: Home / Self Care | Payer: Medicaid Other | Attending: Emergency Medicine | Admitting: Emergency Medicine

## 2010-08-10 ENCOUNTER — Encounter (HOSPITAL_COMMUNITY): Payer: Self-pay

## 2010-08-10 ENCOUNTER — Emergency Department (HOSPITAL_COMMUNITY)
Admission: EM | Admit: 2010-08-10 | Discharge: 2010-08-10 | Discharge status: Against Medical Advice | Payer: Medicaid Other | Attending: Emergency Medicine | Admitting: Emergency Medicine

## 2010-08-10 DIAGNOSIS — H00019 Hordeolum externum unspecified eye, unspecified eyelid: Secondary | ICD-10-CM

## 2010-08-10 DIAGNOSIS — F17200 Nicotine dependence, unspecified, uncomplicated: Secondary | ICD-10-CM | POA: Insufficient documentation

## 2010-08-10 DIAGNOSIS — F319 Bipolar disorder, unspecified: Secondary | ICD-10-CM | POA: Insufficient documentation

## 2010-08-10 HISTORY — DX: Bipolar disorder, unspecified: F31.9

## 2010-08-10 MED ORDER — HYDROCODONE-ACETAMINOPHEN 5-325 MG PO TABS: ORAL_TABLET | ORAL | Status: AC

## 2010-08-10 MED ORDER — ERYTHROMYCIN 5 MG/GM OP OINT
TOPICAL_OINTMENT | Freq: Once | OPHTHALMIC | Status: AC
  Administered 2010-08-10: 11:00:00 via OPHTHALMIC
  Filled 2010-08-10: qty 3.5

## 2010-08-10 NOTE — ED Notes (Signed)
Pt not found in ED lobby or parking lot

## 2010-08-10 NOTE — ED Notes (Signed)
[place] and waking up Monday with the area around her rt eye red and swollen.  Pt denies any insect bites.  Pt reports severe pain to her eye.  Pt denies any drainage, but reports her eyes "watering" more often.

## 2010-08-10 NOTE — ED Provider Notes (Signed)
History     Chief Complaint  Patient presents with  . Eye Problem   HPI Comments: Patient c/o mild itching, redness and pain to the lateral right lower eyelid for several days.  Noticed a "bump" to the outer lower eyelid [place].  Also has occasional crusting to the eyelid.  States that she has slight blurring of her vision but no visual changes.  She also denies FB sensation, injury,  headaches, vomiting , weakness or neck pain or stiffness.    Patient is a 45 y.o. female presenting with eye problem. The history is provided by the patient.  Eye Problem  This is a new problem. The current episode started more than 2 days ago. The problem occurs constantly. The problem has not changed since onset.There is pain in the right eye. There was no injury mechanism. The pain is moderate. There is no history of trauma to the eye. There is no known exposure to pink eye. She does not wear contacts. Associated symptoms include blurred vision, eye redness and itching. Pertinent negatives include no numbness, no decreased vision, no discharge, no foreign body sensation, no photophobia, no nausea, no vomiting, no tingling and no weakness. She has tried nothing for the symptoms.    Past Medical History  Diagnosis Date  . Bipolar 1 disorder     Past Surgical History  Procedure Date  . Tonsillectomy   . Abdominal hysterectomy   . Ovarian cyst removal     History reviewed. No pertinent family history.  History  Substance Use Topics  . Smoking status: Current Everyday Smoker -- 0.5 packs/day    Types: Cigarettes  . Smokeless tobacco: Not on file  . Alcohol Use: Yes     rarely    OB History    Grav Para Term Preterm Abortions TAB SAB Ect Mult Living                  Review of Systems  Constitutional: Negative for fever, chills, appetite change and fatigue.  HENT: Positive for sore throat. Negative for ear pain, sneezing, neck pain, neck stiffness and dental problem.     Eyes: Positive for blurred vision, pain, redness and itching. Negative for photophobia, discharge and visual disturbance.  Respiratory: Negative for cough and chest tightness.   Cardiovascular: Negative.   Gastrointestinal: Negative for nausea, vomiting and abdominal pain.  Musculoskeletal: Negative.   Skin: Positive for itching.  Neurological: Negative for dizziness, tingling, facial asymmetry, weakness, numbness and headaches.  Hematological: Does not bruise/bleed easily.    Physical Exam  BP 176/93  Pulse 67  Temp(Src) 98.7 F (37.1 C) (Oral)  Resp 20  Ht 5\' 2"  (1.575 m)  Wt 133 lb (60.328 kg)  BMI 24.33 kg/m2  SpO2 100%  Physical Exam  Constitutional: She appears well-developed and well-nourished. No distress.  HENT:  Head: Normocephalic and atraumatic.  Right Ear: External ear normal.  Left Ear: External ear normal.  Mouth/Throat: No oropharyngeal exudate.  Eyes: EOM are normal. Pupils are equal, round, and reactive to light. Right eye exhibits hordeolum. Right eye exhibits no chemosis, no discharge and no exudate. Foreign body present in the right eye. Left eye exhibits no chemosis, no discharge, no exudate and no hordeolum. No foreign body present in the left eye. Right conjunctiva is injected. Left conjunctiva is not injected. Left conjunctiva has no hemorrhage.    Neck: Normal range of motion. Neck supple. No thyromegaly present.  Cardiovascular: Normal rate and normal  heart sounds.   Pulmonary/Chest: Effort normal and breath sounds normal.  Musculoskeletal: She exhibits no edema and no tenderness.  Lymphadenopathy:    She has no cervical adenopathy.  Neurological: No cranial nerve deficit. Coordination normal.  Skin: Skin is warm and dry.    ED Course  Procedures  MDM   See nursing note for visual acuity.  Small palpable nodule with slightly whitish center to the lateral aspect of the right lower eyelid.  EOM's intact.  No periorbital erythema or swelling.   Pt is non-toxic appearing      Josselyne Onofrio L. Marshea Wisher, [place] 08/10/10 1128

## 2010-08-13 NOTE — ED Provider Notes (Signed)
History     Chief Complaint  Patient presents with  . Eye Problem   HPI  Past Medical History  Diagnosis Date  . Bipolar 1 disorder     Past Surgical History  Procedure Date  . Tonsillectomy   . Abdominal hysterectomy   . Ovarian cyst removal     History reviewed. No pertinent family history.  History  Substance Use Topics  . Smoking status: Current Everyday Smoker -- 0.5 packs/day    Types: Cigarettes  . Smokeless tobacco: Not on file  . Alcohol Use: Yes     rarely    OB History    Grav Para Term Preterm Abortions TAB SAB Ect Mult Living                  Review of Systems  Physical Exam  BP 176/93  Pulse 67  Temp(Src) 98.7 F (37.1 C) (Oral)  Resp 20  Ht 5\' 2"  (1.575 m)  Wt 133 lb (60.328 kg)  BMI 24.33 kg/m2  SpO2 100%  Physical Exam  ED Course  Procedures    I performed a history and physical examination of Sandra Santos and discussed her management with Dr. .  I agree with the history, physical, assessment, and plan of care, with the following exceptions: None  I was present for the following procedures: None Time Spent in Critical Care of the patient: None Time spent in discussions with the patient and family:   Vernona Rieger, MD 08/13/10 [phone]

## 2010-09-09 ENCOUNTER — Encounter (HOSPITAL_COMMUNITY): Payer: Self-pay | Admitting: Emergency Medicine

## 2010-09-09 ENCOUNTER — Emergency Department (HOSPITAL_COMMUNITY)
Admission: EM | Admit: 2010-09-09 | Discharge: 2010-09-09 | Disposition: A | Discharge status: Home / Self Care | Payer: Medicaid Other | Attending: Emergency Medicine | Admitting: Emergency Medicine

## 2010-09-09 DIAGNOSIS — F319 Bipolar disorder, unspecified: Secondary | ICD-10-CM | POA: Insufficient documentation

## 2010-09-09 DIAGNOSIS — S39012A Strain of muscle, fascia and tendon of lower back, initial encounter: Secondary | ICD-10-CM

## 2010-09-09 DIAGNOSIS — S335XXA Sprain of ligaments of lumbar spine, initial encounter: Secondary | ICD-10-CM | POA: Insufficient documentation

## 2010-09-09 DIAGNOSIS — X500XXA Overexertion from strenuous movement or load, initial encounter: Secondary | ICD-10-CM | POA: Insufficient documentation

## 2010-09-09 HISTORY — DX: Unspecified osteoarthritis, unspecified site: M19.90

## 2010-09-09 HISTORY — DX: Borderline personality disorder: F60.3

## 2010-09-09 MED ORDER — HYDROCODONE-ACETAMINOPHEN 5-500 MG PO TABS: 1.0000 | ORAL_TABLET | Freq: Four times a day (QID) | ORAL | Status: AC | PRN

## 2010-09-09 MED ORDER — METHOCARBAMOL 500 MG PO TABS
1000.0000 mg | ORAL_TABLET | Freq: Once | ORAL | Status: AC
  Administered 2010-09-09: 1000 mg via ORAL
  Filled 2010-09-09: qty 2

## 2010-09-09 MED ORDER — METHOCARBAMOL 750 MG PO TABS: ORAL_TABLET | ORAL | Status: DC

## 2010-09-09 MED ORDER — HYDROCODONE-ACETAMINOPHEN 5-325 MG PO TABS
1.0000 | ORAL_TABLET | Freq: Once | ORAL | Status: AC
  Administered 2010-09-09: 1 via ORAL
  Filled 2010-09-09: qty 1

## 2010-09-09 NOTE — ED Notes (Signed)
Patient c/o right middle to lower back pain. Per patient helping moving cough yesterday and felt rip. Patient states "I thought I just pulled a muscle and tried to see if it would get better but it didn't."

## 2010-09-09 NOTE — ED Provider Notes (Signed)
History     CSN: [id number] Arrival date & time: 09/09/2010  6:07 PM  Chief Complaint  Patient presents with  . Back Pain   Patient is a 45 y.o. female presenting with back pain. The history is provided by the patient. No language interpreter was used.  Back Pain  This is a new problem. The current episode started yesterday. The problem occurs constantly. Associated with: lifting/moving a couch. The pain is present in the lumbar spine. The quality of the pain is described as shooting. The pain does not radiate. The pain is at a severity of 8/10. The symptoms are aggravated by bending and twisting. She has tried nothing for the symptoms.    Past Medical History  Diagnosis Date  . Bipolar 1 disorder   . Borderline personality disorder   . Arthritis     Past Surgical History  Procedure Date  . Tonsillectomy   . Abdominal hysterectomy   . Ovarian cyst removal     Family History  Problem Relation Age of Onset  . Cancer Mother   . Diabetes Mother   . Asthma Mother   . Hypertension Mother   . Heart attack Father   . COPD Father   . Hypertension Father     History  Substance Use Topics  . Smoking status: Current Everyday Smoker -- 0.5 packs/day for 30 years    Types: Cigarettes  . Smokeless tobacco: Never Used  . Alcohol Use: Yes     rarely    OB History    Grav Para Term Preterm Abortions TAB SAB Ect Mult Living   5 3 2 1 2 2    3       Review of Systems  Musculoskeletal: Positive for back pain.    Physical Exam  BP 112/70  Pulse 60  Temp(Src) 98.5 F (36.9 C) (Oral)  Resp 19  Ht 5\' 2"  (1.575 m)  Wt 133 lb (60.328 kg)  BMI 24.33 kg/m2  SpO2 100%  Physical Exam  Nursing note and vitals reviewed. Constitutional: She is oriented to person, place, and time. Vital signs are normal. She appears well-developed and well-nourished. No distress.  HENT:  Head: Normocephalic and atraumatic.  Right Ear: External ear normal.  Left Ear: External ear normal.  Nose:  Nose normal.  Mouth/Throat: No oropharyngeal exudate.  Eyes: Conjunctivae and EOM are normal. Pupils are equal, round, and reactive to light. Right eye exhibits no discharge. Left eye exhibits no discharge. No scleral icterus.  Neck: Normal range of motion. Neck supple. No JVD present. No tracheal deviation present. No thyromegaly present.  Cardiovascular: Normal rate, regular rhythm, normal heart sounds, intact distal pulses and normal pulses.  Exam reveals no gallop and no friction rub.   No murmur heard. Pulmonary/Chest: Effort normal and breath sounds normal. No stridor. No respiratory distress. She has no wheezes. She has no rales. She exhibits no tenderness.  Abdominal: Soft. Normal appearance and bowel sounds are normal. She exhibits no distension and no mass. There is no tenderness. There is no rebound and no guarding.  Musculoskeletal: Normal range of motion. She exhibits tenderness. She exhibits no edema.       Arms: Lymphadenopathy:    She has no cervical adenopathy.  Neurological: She is alert and oriented to person, place, and time. She has normal reflexes. No cranial nerve deficit. Coordination normal. GCS eye subscore is 4. GCS verbal subscore is 5. GCS motor subscore is 6.  Reflex Scores:      Patellar  reflexes are 2+ on the right side and 2+ on the left side.      Achilles reflexes are 2+ on the right side and 2+ on the left side. Skin: Skin is warm and dry. No rash noted. She is not diaphoretic.  Psychiatric: She has a normal mood and affect. Her speech is normal and behavior is normal. Judgment and thought content normal. Cognition and memory are normal.    ED Course  Procedures  MDM       Worthy Rancher, PA 09/09/10 1850

## 2010-09-11 ENCOUNTER — Emergency Department (HOSPITAL_COMMUNITY): Payer: Medicaid Other

## 2010-09-11 ENCOUNTER — Encounter (HOSPITAL_COMMUNITY): Payer: Self-pay | Admitting: *Deleted

## 2010-09-11 ENCOUNTER — Emergency Department (HOSPITAL_COMMUNITY)
Admission: EM | Admit: 2010-09-11 | Discharge: 2010-09-11 | Disposition: A | Discharge status: Home / Self Care | Payer: Medicaid Other | Attending: Emergency Medicine | Admitting: Emergency Medicine

## 2010-09-11 DIAGNOSIS — W1789XA Other fall from one level to another, initial encounter: Secondary | ICD-10-CM | POA: Insufficient documentation

## 2010-09-11 DIAGNOSIS — Y92009 Unspecified place in unspecified non-institutional (private) residence as the place of occurrence of the external cause: Secondary | ICD-10-CM | POA: Insufficient documentation

## 2010-09-11 DIAGNOSIS — F319 Bipolar disorder, unspecified: Secondary | ICD-10-CM | POA: Insufficient documentation

## 2010-09-11 DIAGNOSIS — S93609A Unspecified sprain of unspecified foot, initial encounter: Secondary | ICD-10-CM | POA: Insufficient documentation

## 2010-09-11 DIAGNOSIS — IMO0002 Reserved for concepts with insufficient information to code with codable children: Secondary | ICD-10-CM

## 2010-09-11 DIAGNOSIS — F17200 Nicotine dependence, unspecified, uncomplicated: Secondary | ICD-10-CM | POA: Insufficient documentation

## 2010-09-11 MED ORDER — OXYCODONE-ACETAMINOPHEN 5-325 MG PO TABS
ORAL_TABLET | ORAL | Status: AC
  Filled 2010-09-11: qty 1

## 2010-09-11 MED ORDER — OXYCODONE-ACETAMINOPHEN 5-325 MG PO TABS: 1.0000 | ORAL_TABLET | ORAL | Status: AC | PRN

## 2010-09-11 MED ORDER — OXYCODONE-ACETAMINOPHEN 5-325 MG PO TABS
6.0000 | ORAL_TABLET | Freq: Once | ORAL | Status: AC
  Administered 2010-09-11: 6 via ORAL

## 2010-09-11 MED ORDER — OXYCODONE-ACETAMINOPHEN 5-325 MG PO TABS
1.0000 | ORAL_TABLET | Freq: Once | ORAL | Status: AC
  Administered 2010-09-11: 1 via ORAL

## 2010-09-11 NOTE — ED Notes (Signed)
ASO splint applied to L ankle. Crutch education given. Pt demonstrated and verbalized understanding.

## 2010-09-11 NOTE — ED Provider Notes (Signed)
History     CSN: [id number] Arrival date & time: 09/11/2010  7:59 PM  Chief Complaint  Patient presents with  . Foot Pain    left foot   Patient is a 45 y.o. female presenting with lower extremity pain. The history is provided by the patient.  Foot Pain This is a new (Patient stumbled going down a hill last night at her home,  hyperextending her left foot behind when she fell.) problem. The current episode started yesterday. The problem occurs constantly. The problem has been gradually worsening. Associated symptoms include arthralgias and joint swelling. Pertinent negatives include no abdominal pain, chest pain, chills, fever, headaches, nausea, neck pain, numbness, rash, sore throat or weakness. Associated symptoms comments: Bruising . The symptoms are aggravated by twisting and walking (Palpation). She has tried acetaminophen (She also took one leftover hydrcodone tablet at 1 pm today with no relief.) for the symptoms.    Past Medical History  Diagnosis Date  . Bipolar 1 disorder   . Borderline personality disorder   . Arthritis     Past Surgical History  Procedure Date  . Tonsillectomy   . Abdominal hysterectomy   . Ovarian cyst removal     Family History  Problem Relation Age of Onset  . Cancer Mother   . Diabetes Mother   . Asthma Mother   . Hypertension Mother   . Heart attack Father   . COPD Father   . Hypertension Father     History  Substance Use Topics  . Smoking status: Current Everyday Smoker -- 0.5 packs/day for 30 years    Types: Cigarettes  . Smokeless tobacco: Never Used  . Alcohol Use: Yes     rarely    OB History    Grav Para Term Preterm Abortions TAB SAB Ect Mult Living   5 3 2 1 2 2    3       Review of Systems  Constitutional: Negative for fever and chills.  HENT: Negative for sore throat and neck pain.   Eyes: Negative.   Respiratory: Negative for chest tightness and shortness of breath.   Cardiovascular: Negative for chest pain.    Gastrointestinal: Negative for nausea and abdominal pain.  Genitourinary: Negative.   Musculoskeletal: Positive for joint swelling, arthralgias and gait problem.  Skin: Negative.  Negative for rash and wound.  Neurological: Negative for dizziness, weakness, light-headedness, numbness and headaches.  Hematological: Negative.   Psychiatric/Behavioral: Negative.     Physical Exam  BP 130/78  Pulse 61  Temp(Src) 98.5 F (36.9 C) (Oral)  Resp 16  Ht 5' 2.5" (1.588 m)  Wt 133 lb (60.328 kg)  BMI 23.94 kg/m2  SpO2 100%  Physical Exam  Nursing note and vitals reviewed. Constitutional: She is oriented to person, place, and time. She appears well-developed and well-nourished.  HENT:  Head: Normocephalic.  Eyes: Conjunctivae are normal.  Neck: Normal range of motion.  Cardiovascular: Normal rate and intact distal pulses.  Exam reveals no decreased pulses.   Pulses:      Dorsalis pedis pulses are 2+ on the right side, and 2+ on the left side.       Posterior tibial pulses are 2+ on the right side, and 2+ on the left side.  Pulmonary/Chest: Effort normal.  Musculoskeletal: She exhibits edema and tenderness.       Right ankle: Achilles tendon normal.       Left foot: She exhibits decreased range of motion, tenderness, bony tenderness and swelling. She  exhibits normal capillary refill, no crepitus and no deformity.  Neurological: She is alert and oriented to person, place, and time. No sensory deficit.  Skin: Skin is warm, dry and intact.    ED Course  Procedures  MDM  Significant foot sprain with possible subtle talar chip fragment.  Posterior splint,  Crutches.  F/u with Dr Romeo Apple.      Candis Musa, PA 09/11/10 2114

## 2010-09-11 NOTE — Discharge Instructions (Signed)
Foot Sprain The muscles and cord like structures which attach muscle to bone (tendons) that surround the feet are made up of units. A foot sprain can occur at the weakest spot in any of these units. This condition is most often caused by injury to or overuse of the foot, as from playing contact sports, or aggravating a previous injury, or from poor conditioning, or obesity.  DIAGNOSIS Diagnosis of this condition is usually by your own observation. If problems continue, a caregiver may be required for further evaluation and treatment. X-rays may be required to make sure there are not breaks in the bones (fractures) present. Continued problems may require physical therapy for treatment. SYMPTOMS OF FOOT SPRAIN ARE:  Pain with movement of the foot.   Tenderness and swelling at the injury site.   Loss of strength is present in moderate or severe sprains.  THE THREE GRADES OR SEVERITY OF FOOT SPRAIN ARE:  Mild (Grade I): Slightly pulled muscle without tearing of muscle or tendon fibers or loss of strength.   Moderate (Grade II): Tearing of fibers in a muscle, tendon, or at the attachment to bone, with small decrease in strength.   Severe (Grade III): Rupture of the muscle-tendon-bone attachment, with separation of fibers. Severe sprain requires surgical repair. Often repeating (chronic) sprains are caused by overuse. Sudden (acute) sprains are caused by direct injury or over-use.  HOME CARE INSTRUCTIONS  Apply ice to the injury for 10 minutes every hour while awake for the next 2 days. Put the ice in a plastic bag and place a towel between the bag of ice and your skin.   An elastic wrap (like an Ace bandage) may be used to keep swelling down.   Keep foot above the level of the heart, or at least raised on a footstool, when swelling and pain are present.   Try to avoid use other than gentle range of motion while the foot is painful. Do not resume use until instructed by your caregiver. Then  begin use gradually, not increasing use to the point of pain. If pain does develop, decrease use and continue the above measures, gradually increasing activities that do not cause discomfort, until you gradually achieve normal use.   Use crutches if and as instructed, and for the length of time instructed.   Keep injured foot and ankle wrapped between treatments.   Massage foot and ankle for comfort and to keep swelling down. Massage from the toes up towards the knee.   Only take over-the-counter or prescription medicines for pain, discomfort, or fever as directed by your caregiver.  PREVENTION OF FOOT SPRAIN  Use strength and conditioning exercises appropriate for your sport.   Warm up properly prior to working out.   Use athletic shoes that are made for the sport you are participating in.   Allow adequate time for healing. Early return to activities makes repeat injury more likely, and can lead to an unstable arthritic foot that can result in prolonged disability. Mild sprains generally heal in 3 to 10 days, with moderate and severe sprains taking 2 to 10 weeks. Your caregiver can help you determine the proper time required for healing.  SEEK IMMEDIATE MEDICAL CARE IF:  Your pain and swelling increase, or pain is not controlled with medications.   You have loss of feeling in your foot or your foot turns cold or blue.   You develop new, unexplained symptoms, or an increase of the symptoms that brought you to your caregiver.  MAKE SURE YOU:   Understand these instructions.   Will watch your condition.   Will get help right away if you are not doing well or get worse.  Document Released: 06/23/2001 Document Re-Released: 03/28/2009 [facility] Patient Information 2011 [place], [place].

## 2010-09-11 NOTE — ED Notes (Signed)
6 pack of percocet given to pt to take home per PA order

## 2010-09-11 NOTE — ED Notes (Signed)
Pt c/o left foot pain after falling down a hill yesterday; left foot is swollen and has some bruising noted

## 2010-09-11 NOTE — ED Provider Notes (Signed)
Medical screening examination/treatment/procedure(s) were performed by non-physician practitioner and as supervising physician I was immediately available for consultation/collaboration.Devoria Albe, MD, Armando Gang  Ward Givens, MD 09/11/10 [phone]

## 2010-09-25 NOTE — ED Provider Notes (Signed)
Evaluation and management procedures were performed by the mid-level provider (PA/NP/CNM) under my supervision/collaboration. I was present and available during the ED course. Deshonna Trnka Y.   Gavin Pound. Oletta Lamas, MD 09/25/10 [phone]

## 2010-09-28 ENCOUNTER — Ambulatory Visit (INDEPENDENT_AMBULATORY_CARE_PROVIDER_SITE_OTHER): Payer: Medicaid Other | Admitting: Orthopedic Surgery

## 2010-09-28 ENCOUNTER — Encounter: Payer: Self-pay | Admitting: Orthopedic Surgery

## 2010-09-28 VITALS — BP 112/82 | Ht 62.0 in | Wt 128.0 lb

## 2010-09-28 DIAGNOSIS — S93409A Sprain of unspecified ligament of unspecified ankle, initial encounter: Secondary | ICD-10-CM | POA: Insufficient documentation

## 2010-09-28 MED ORDER — HYDROCODONE-ACETAMINOPHEN 7.5-325 MG PO TABS: 1.0000 | ORAL_TABLET | Freq: Four times a day (QID) | ORAL | Status: AC | PRN

## 2010-09-28 MED ORDER — IBUPROFEN 800 MG PO TABS: 800.0000 mg | ORAL_TABLET | Freq: Three times a day (TID) | ORAL | Status: AC | PRN

## 2010-09-28 NOTE — Patient Instructions (Signed)
Ice as needed  Wear brace x 6 more weeks

## 2010-09-28 NOTE — Progress Notes (Signed)
Pain LEFT foot  45 year old female consult requested  Patient injured her foot on August 27 falling down a hill.  Treatment was with an ASO brace and Percocet 5 mg for pain  Complains of throbbing stabbing 7/10 pain in her RIGHT foot which is worse with movement and better with rest.  The dorsum of the foot is numb, she has some subcutaneous bruising.  Initial swelling is improving.  Review of systems unexpected weight loss, numbness, anxiety, depression, seasonal ALLERGY.  Past Medical History  Diagnosis Date  . Bipolar 1 disorder   . Borderline personality disorder   . Arthritis     Past Surgical History  Procedure Date  . Tonsillectomy   . Abdominal hysterectomy   . Ovarian cyst removal   . Cesarean section     Physical Exam(12) GENERAL: normal development   CDV: pulses are normal   Skin: normal  Lymph: nodes were not palpable/normal  Psychiatric: awake, alert and oriented  Neuro: normal sensation  [facility] Ambulatory with an ASO brace on the LEFT foot and ankle and the RIGHT handed crutch.  She is tender over the anterolateral talofibular ligament and also over the dorsum of the ankle joint.  She has dorsiflexion of 5 plantar flexion 20.  She has weakness of her evertors and dorsiflexors.  Plantar flexors are normal.  Ankle joint is stable   Imaging: Films from outside and they show a dorsal avulsion from the table as.  Foot films are negative.  Assessment: Sprain ankle    Plan: ASO brace weight-bear as tolerated anti-inflammatory oral analgesics follow up as needed

## 2010-10-13 LAB — BASIC METABOLIC PANEL
BUN: 11
CO2: 25
CO2: 27
Chloride: 108
GFR calc non Af Amer: >60
Glucose, Bld: 83
Glucose, Bld: 96
Potassium: 3.4 — ABNORMAL LOW
Potassium: 4.3
Sodium: 138
Sodium: 139

## 2010-10-13 LAB — URINALYSIS, ROUTINE W REFLEX MICROSCOPIC
Glucose, UA: NEGATIVE
Nitrite: POSITIVE — AB
Protein, ur: NEGATIVE
Urobilinogen, UA: 0.2

## 2010-10-13 LAB — RAPID URINE DRUG SCREEN, HOSP PERFORMED
Benzodiazepines: POSITIVE — AB
Tetrahydrocannabinol: NOT DETECTED

## 2010-10-13 LAB — HEPATIC FUNCTION PANEL
ALT: 60 — ABNORMAL HIGH
Albumin: 3.5
Alkaline Phosphatase: 75
Total Protein: 5.7 — ABNORMAL LOW

## 2010-10-13 LAB — DIFFERENTIAL
Basophils Absolute: 0.1
Basophils Absolute: 0.1
Eosinophils Absolute: 0.3
Eosinophils Relative: 3
Eosinophils Relative: 3
Lymphocytes Relative: 30
Monocytes Absolute: 0.4
Monocytes Absolute: 0.6
Monocytes Relative: 5

## 2010-10-13 LAB — CBC
HCT: 39.1
HCT: 44.3
Hemoglobin: 13.4
Hemoglobin: 15
MCHC: 33.8
MCHC: 34.3
MCV: 99.7
Platelets: 186
RDW: 13.4
RDW: 13.5

## 2010-10-13 LAB — URINE MICROSCOPIC-ADD ON

## 2010-10-13 LAB — LITHIUM LEVEL: Lithium Lvl: 0.91

## 2010-10-27 LAB — DIFFERENTIAL
Basophils Absolute: 0.1
Basophils Relative: 0
Basophils Relative: 1
Eosinophils Absolute: 0.2
Eosinophils Absolute: 0.2
Eosinophils Relative: 2
Monocytes Absolute: 0.4
Monocytes Absolute: 0.5
Monocytes Relative: 5

## 2010-10-27 LAB — BASIC METABOLIC PANEL
BUN: 8
CO2: 23
CO2: 26
Chloride: 111
GFR calc Af Amer: >60
Glucose, Bld: 85
Potassium: 3.7
Sodium: 140
Sodium: 140

## 2010-10-27 LAB — URINE MICROSCOPIC-ADD ON

## 2010-10-27 LAB — CBC
HCT: 39.7
Hemoglobin: 13.2
Hemoglobin: 13.5
MCHC: 33.9
MCHC: 34.1
MCV: 97.7
MCV: 98.4
RBC: 3.93
RDW: 13.6

## 2010-10-27 LAB — BLOOD GAS, ARTERIAL
Acid-base deficit: 4.1 — ABNORMAL HIGH
TCO2: 17.6
pCO2 arterial: 32 — ABNORMAL LOW

## 2010-10-27 LAB — RAPID URINE DRUG SCREEN, HOSP PERFORMED
Cocaine: NOT DETECTED
Opiates: POSITIVE — AB
Tetrahydrocannabinol: NOT DETECTED

## 2010-10-27 LAB — URINALYSIS, ROUTINE W REFLEX MICROSCOPIC
Bilirubin Urine: NEGATIVE
Glucose, UA: NEGATIVE
Ketones, ur: NEGATIVE
Protein, ur: NEGATIVE

## 2010-10-27 LAB — ACETAMINOPHEN LEVEL: Acetaminophen (Tylenol), Serum: <10 — ABNORMAL LOW

## 2010-11-01 LAB — BASIC METABOLIC PANEL
BUN: 11
Calcium: 8.9
GFR calc non Af Amer: >60
Glucose, Bld: 99

## 2010-11-01 LAB — CBC
HCT: 39
Platelets: 264
RDW: 13.3

## 2010-11-03 ENCOUNTER — Telehealth: Payer: Self-pay | Admitting: Orthopedic Surgery

## 2010-11-03 NOTE — Telephone Encounter (Signed)
Patient called Friday to relay that her left foot and ankle does not seem to be feeling all that much better; requests another appointment.  States has been wearing brace as recommended and icing x 6 weeks (said it will be 6 weeks this coming week).  Office note indicates: "Return: none."  Any further recommendations?  Her ph# is [phone].

## 2010-11-06 NOTE — Telephone Encounter (Signed)
I called to relay; left message for patient to return call.

## 2010-11-06 NOTE — Telephone Encounter (Signed)
Schedule for a new appt when we have an opening

## 2010-11-08 NOTE — Telephone Encounter (Signed)
Patient returned call - appointment scheduled

## 2010-11-21 ENCOUNTER — Ambulatory Visit (INDEPENDENT_AMBULATORY_CARE_PROVIDER_SITE_OTHER): Payer: Medicaid Other | Admitting: Orthopedic Surgery

## 2010-11-21 VITALS — Ht 62.0 in | Wt 128.0 lb

## 2010-11-21 DIAGNOSIS — S93409A Sprain of unspecified ligament of unspecified ankle, initial encounter: Secondary | ICD-10-CM

## 2010-11-21 MED ORDER — HYDROCODONE-ACETAMINOPHEN 5-325 MG PO TABS
1.0000 | ORAL_TABLET | Freq: Four times a day (QID) | ORAL | Status: AC | PRN
Start: 2010-11-21 — End: 2010-12-01

## 2010-11-21 MED ORDER — IBUPROFEN 800 MG PO TABS: 800.0000 mg | ORAL_TABLET | Freq: Three times a day (TID) | ORAL | Status: AC | PRN

## 2010-11-21 NOTE — Patient Instructions (Signed)
Brace   Medications   PT in [place]

## 2010-11-22 ENCOUNTER — Encounter: Payer: Self-pay | Admitting: Orthopedic Surgery

## 2010-11-22 NOTE — Progress Notes (Signed)
Recheck visit  Previous history:  45 year old female consult requested  Patient injured her foot on August 27 falling down a hill. Treatment was with an ASO brace and Percocet 5 mg for pain  Complains of throbbing stabbing 7/10 pain in her RIGHT foot which is worse with movement and better with rest. The dorsum of the foot is numb, she has some subcutaneous bruising. Initial swelling is improving.  The patient returns with continued pain and numbness in the dorsum of the RIGHT foot.  Initial x-rays were normal  Exam patient comes in with a small limp favoring her RIGHT leg and foot.  She is hyper sensitivity over the dorsum of the foot.  No swelling.  Ankle stable.  Muscle tone normal.  Skin intact.  Overall grooming moderate.  Pulse was palpable and normal color and temperature of the foot were normal.  Collateral ligaments were stable.  Question cause of continued pain, differential diagnosis: complex regional pain syndrome vs. Unresolved ankle sprain vs. Stress fracture vs. Exacerbation of symptoms for secondary gain Recommend physical therapy, analgesics.  If no improvement after 4-6 weeks MRI

## 2011-01-02 ENCOUNTER — Ambulatory Visit (INDEPENDENT_AMBULATORY_CARE_PROVIDER_SITE_OTHER): Payer: Medicaid Other | Admitting: Orthopedic Surgery

## 2011-01-02 ENCOUNTER — Encounter: Payer: Self-pay | Admitting: Orthopedic Surgery

## 2011-01-02 VITALS — BP 100/70 | Ht 62.0 in | Wt 126.0 lb

## 2011-01-02 DIAGNOSIS — IMO0002 Reserved for concepts with insufficient information to code with codable children: Secondary | ICD-10-CM

## 2011-01-02 DIAGNOSIS — S93409A Sprain of unspecified ligament of unspecified ankle, initial encounter: Secondary | ICD-10-CM

## 2011-01-02 NOTE — Progress Notes (Signed)
Patient ID: Sandra Santos, female   DOB: 1965-04-25, 45 y.o.   MRN: [id number] 45 year old female consult requested  Patient injured her foot on August 27 falling down a hill. Treatment was with an ASO brace and Percocet 5 mg for pain  Complains of throbbing stabbing 7/10 pain in her RIGHT foot which is worse with movement and better with rest. The dorsum of the foot is numb, she has some subcutaneous bruising. Initial swelling is improving.  The patient returns with continued pain and numbness in the dorsum of the RIGHT foot. Initial x-rays were normal  Exam patient comes in with a small limp favoring her RIGHT leg and foot. She is hyper sensitivity over the dorsum of the foot. No swelling. Ankle stable. Muscle tone normal. Skin intact. Overall grooming moderate. Pulse was palpable and normal color and temperature of the foot were normal. Collateral ligaments were stable.  Question cause of continued pain, differential diagnosis: complex regional pain syndrome vs. Unresolved ankle sprain vs. Stress fracture vs. Exacerbation of symptoms for secondary gain  Recommend physical therapy, analgesics. If no improvement after 4-6 weeks MRI  The patient did not go to physical therapy.  She gave me some excuses about her car in her insurance.  She still has the same complaints I still have the same concerns.  Her ankle is not swollen.  She does complain of pain when she is palpated over the anterior talofibular ligament posterior malleolus.  Her ankle range of motion passively is normal actively it is strictly a question reason.  I advised her to go back to the therapist and when she has completed the therapist to call us for repeat evaluation.  There is no need for evaluation without completing physical therapy.  She can continue on Ultracet for Ultram for pain

## 2011-01-02 NOTE — Patient Instructions (Signed)
Resume therapy   Come back after the therapy has been done for 4 weeks

## 2011-01-03 ENCOUNTER — Encounter: Payer: Self-pay | Admitting: Orthopedic Surgery

## 2011-02-04 ENCOUNTER — Emergency Department (HOSPITAL_COMMUNITY)
Admission: EM | Admit: 2011-02-04 | Discharge: 2011-02-04 | Disposition: A | Discharge status: Home / Self Care | Payer: Medicaid Other | Attending: Emergency Medicine | Admitting: Emergency Medicine

## 2011-02-04 ENCOUNTER — Encounter (HOSPITAL_COMMUNITY): Payer: Self-pay | Admitting: *Deleted

## 2011-02-04 ENCOUNTER — Emergency Department (HOSPITAL_COMMUNITY): Payer: Medicaid Other

## 2011-02-04 DIAGNOSIS — F319 Bipolar disorder, unspecified: Secondary | ICD-10-CM | POA: Insufficient documentation

## 2011-02-04 DIAGNOSIS — S5001XA Contusion of right elbow, initial encounter: Secondary | ICD-10-CM

## 2011-02-04 DIAGNOSIS — Z8739 Personal history of other diseases of the musculoskeletal system and connective tissue: Secondary | ICD-10-CM | POA: Insufficient documentation

## 2011-02-04 DIAGNOSIS — F17200 Nicotine dependence, unspecified, uncomplicated: Secondary | ICD-10-CM | POA: Insufficient documentation

## 2011-02-04 DIAGNOSIS — S5000XA Contusion of unspecified elbow, initial encounter: Secondary | ICD-10-CM | POA: Insufficient documentation

## 2011-02-04 DIAGNOSIS — W2209XA Striking against other stationary object, initial encounter: Secondary | ICD-10-CM | POA: Insufficient documentation

## 2011-02-04 MED ORDER — NAPROXEN 250 MG PO TABS: 250.0000 mg | ORAL_TABLET | Freq: Two times a day (BID) | ORAL | Status: DC

## 2011-02-04 MED ORDER — HYDROCODONE-ACETAMINOPHEN 5-325 MG PO TABS: ORAL_TABLET | ORAL | Status: AC

## 2011-02-04 NOTE — ED Provider Notes (Signed)
History     CSN: [id number]  Arrival date & time 02/04/11  1448   Chief Complaint  Patient presents with  . Elbow Injury    HPI Pt was seen at 1645.  Per pt, c/o gradual onset and persistence of constant right lateral elbow "pain" that began after she hit it against a doorjam 2 weeks ago.  Pt is right handed.  Pt has not taken OTC pain meds for same.  Denies focal motor weakness, no tingling/numbness in extremity, no open wound, no edema, no rash.     Past Medical History  Diagnosis Date  . Bipolar 1 disorder   . Borderline personality disorder   . Arthritis     Past Surgical History  Procedure Date  . Tonsillectomy   . Abdominal hysterectomy   . Ovarian cyst removal   . Cesarean section     Family History  Problem Relation Age of Onset  . Cancer Mother   . Diabetes Mother   . Asthma Mother   . Hypertension Mother   . Heart attack Father   . COPD Father   . Hypertension Father   . Arthritis    . Lung disease      History  Substance Use Topics  . Smoking status: Current Everyday Smoker -- 0.5 packs/day for 30 years    Types: Cigarettes  . Smokeless tobacco: Never Used  . Alcohol Use: Yes     rarely    OB History    Grav Para Term Preterm Abortions TAB SAB Ect Mult Living   5 3 2 1 2 2    3       Review of Systems ROS: Statement: All systems negative except as marked or noted in the HPI; Constitutional: Negative for fever and chills. ; ; Eyes: Negative for eye pain, redness and discharge. ; ; ENMT: Negative for ear pain, hoarseness, nasal congestion, sinus pressure and sore throat. ; ; Cardiovascular: Negative for chest pain, palpitations, diaphoresis, dyspnea and peripheral edema. ; ; Respiratory: Negative for cough, wheezing and stridor. ; ; Gastrointestinal: Negative for nausea, vomiting, diarrhea, abdominal pain, blood in stool, hematemesis, jaundice and rectal bleeding. . ; ; Genitourinary: Negative for dysuria, flank pain and hematuria. ; ;  Musculoskeletal: Negative for back pain and neck pain. Negative for swelling, +right elbow pain.; ; Skin: Negative for pruritus, rash, abrasions, blisters, bruising and skin lesion.; ; Neuro: Negative for headache, lightheadedness and neck stiffness. Negative for weakness, altered level of consciousness , altered mental status, extremity weakness, paresthesias, involuntary movement, seizure and syncope.     Allergies  Review of patient's allergies indicates no known allergies.  Home Medications   Current Outpatient Rx  Name Route Sig Dispense Refill  . AMPHETAMINE-DEXTROAMPHETAMINE 20 MG PO TABS Oral Take 20 mg by mouth daily.      Marland Kitchen PERPHENAZINE 2 MG PO TABS Oral Take 2 mg by mouth 3 (three) times daily.       BP 113/60  Pulse 67  Temp(Src) 98.5 F (36.9 C) (Oral)  Resp 16  Ht 5' 2.5" (1.588 m)  Wt 135 lb (61.236 kg)  BMI 24.30 kg/m2  SpO2 100%  Physical Exam 1650: Physical examination:  Nursing notes reviewed; Vital signs and O2 SAT reviewed;  Constitutional: Well developed, Well nourished, Well hydrated, In no acute distress; Head:  Normocephalic, atraumatic; Eyes: EOMI, PERRL, No scleral icterus; ENMT: Mouth and pharynx normal, Mucous membranes moist; Neck: Supple, Full range of motion, No lymphadenopathy; Cardiovascular: Regular rate and rhythm,  No murmur, rub, or gallop; Respiratory: Breath sounds clear & equal bilaterally, No rales, rhonchi, wheezes, or rub, Normal respiratory effort/excursion; Chest: Nontender, Movement normal; Extremities: Pulses normal, +mild TTP right lateral elbow at bulk of extensor muscles, no overlying edema, erythema, ecchymosis or open wounds. Right shoulder, wrist and hand NT to palp. Sensation intact over deltoid region, distal NMS intact with right hand having intact sensation and strength in the distribution of the median, radial, and ulnar nerve function.  Strong radial pulse.  +FROM right elbow with intact motor strength biceps and triceps muscles to  resistance., No edema, No calf edema or asymmetry.; Neuro: AA&Ox3, Major CN grossly intact.  No gross focal motor or sensory deficits in extremities.; Skin: Color normal, Warm, Dry, no rash.    ED Course  Procedures    MDM  MDM Reviewed: nursing note and vitals Interpretation: x-ray   Dg Elbow Complete Right 02/04/2011  *RADIOLOGY REPORT*  Clinical Data: Elbow pain since falling 2 weeks ago.  RIGHT ELBOW - COMPLETE 3+ VIEW  Comparison: None.  Findings: The mineralization and alignment are normal.  There is no evidence of acute fracture or dislocation.  There is no significant elbow joint effusion.  Minimal spurring of the coronoid process is noted.  IMPRESSION: No acute osseous findings or significant elbow joint effusion.  Original Report Authenticated By: Gerrianne Scale, M.D.     5:21 PM:  Dx testing d/w pt.  Questions answered.  Verb understanding, agreeable to d/c home with outpt f/u.      Novah Nessel Allison Quarry, DO 02/06/11 [phone]

## 2011-02-04 NOTE — ED Notes (Addendum)
Pt c/o pain in her right elbow. States that she hit it on a door 2 weeks ago and it is not getting better.

## 2011-10-15 ENCOUNTER — Emergency Department (HOSPITAL_COMMUNITY)
Admission: EM | Admit: 2011-10-15 | Discharge: 2011-10-15 | Disposition: A | Discharge status: Home / Self Care | Payer: Medicaid Other | Attending: Emergency Medicine | Admitting: Emergency Medicine

## 2011-10-15 ENCOUNTER — Emergency Department (HOSPITAL_COMMUNITY): Payer: Medicaid Other

## 2011-10-15 ENCOUNTER — Encounter (HOSPITAL_COMMUNITY): Payer: Self-pay | Admitting: *Deleted

## 2011-10-15 DIAGNOSIS — F319 Bipolar disorder, unspecified: Secondary | ICD-10-CM | POA: Insufficient documentation

## 2011-10-15 DIAGNOSIS — S52123A Displaced fracture of head of unspecified radius, initial encounter for closed fracture: Secondary | ICD-10-CM | POA: Insufficient documentation

## 2011-10-15 DIAGNOSIS — M129 Arthropathy, unspecified: Secondary | ICD-10-CM | POA: Insufficient documentation

## 2011-10-15 DIAGNOSIS — F17200 Nicotine dependence, unspecified, uncomplicated: Secondary | ICD-10-CM | POA: Insufficient documentation

## 2011-10-15 MED ORDER — OXYCODONE-ACETAMINOPHEN 5-325 MG PO TABS: 1.0000 | ORAL_TABLET | ORAL | Status: AC | PRN

## 2011-10-15 MED ORDER — OXYCODONE-ACETAMINOPHEN 5-325 MG PO TABS
2.0000 | ORAL_TABLET | Freq: Once | ORAL | Status: AC
  Administered 2011-10-15: 2 via ORAL
  Filled 2011-10-15 (×2): qty 1

## 2011-10-15 NOTE — ED Notes (Signed)
Patient transported to X-ray 

## 2011-10-15 NOTE — ED Notes (Signed)
Lt elbow pain, hx of "tennis elbow" but fell today on  Elbow,

## 2011-10-18 NOTE — ED Provider Notes (Signed)
History     CSN: [id number]  Arrival date & time 10/15/11  2027   First MD Initiated Contact with Patient 10/15/11 2154      Chief Complaint  Patient presents with  . Fall    (Consider location/radiation/quality/duration/timing/severity/associated sxs/prior treatment) HPI Comments: Sandra Santos fell prior to arrival today, landing on her left elbow causing immediate sharp, constant pain.  She has used ice and elevation without relief of pain which is becoming worse.  She denies numbness distal to the injury and also denies pain in the left shoulder and wrist.  She did not hit her head. She does have a history of "tennis elbow" in the same joint and has been treated by Dr. Hilda Lias in the past for this.  The history is provided by the patient.    Past Medical History  Diagnosis Date  . Bipolar 1 disorder   . Borderline personality disorder   . Arthritis     Past Surgical History  Procedure Date  . Tonsillectomy   . Abdominal hysterectomy   . Ovarian cyst removal   . Cesarean section     Family History  Problem Relation Age of Onset  . Cancer Mother   . Diabetes Mother   . Asthma Mother   . Hypertension Mother   . Heart attack Father   . COPD Father   . Hypertension Father   . Arthritis    . Lung disease      History  Substance Use Topics  . Smoking status: Current Every Day Smoker -- 0.5 packs/day for 30 years    Types: Cigarettes  . Smokeless tobacco: Never Used  . Alcohol Use: Yes     rarely    OB History    Grav Para Term Preterm Abortions TAB SAB Ect Mult Living   5 3 2 1 2 2    3       Review of Systems  Musculoskeletal: Positive for joint swelling and arthralgias.  Skin: Negative for wound.  Neurological: Negative for weakness and numbness.    Allergies  Review of patient's allergies indicates no known allergies.  Home Medications   Current Outpatient Rx  Name Route Sig Dispense Refill  . RISPERIDONE 1 MG PO TABS Oral Take 1 mg by mouth  2 (two) times daily.    . TRAZODONE HCL 100 MG PO TABS Oral Take 200 mg by mouth at bedtime.    . OXYCODONE-ACETAMINOPHEN 5-325 MG PO TABS Oral Take 1 tablet by mouth every 4 (four) hours as needed for pain. 30 tablet 0    BP 122/83  Pulse 60  Temp 98.2 F (36.8 C) (Oral)  Resp 20  Ht 5\' 2"  (1.575 m)  Wt 130 lb (58.968 kg)  BMI 23.78 kg/m2  SpO2 100%  Physical Exam  Constitutional: She appears well-developed and well-nourished.  HENT:  Head: Atraumatic.  Neck: Normal range of motion.  Cardiovascular: Intact distal pulses.   Pulses:      Radial pulses are 2+ on the right side, and 2+ on the left side.       Pulses equal bilaterally  Musculoskeletal: She exhibits edema and tenderness.       Left elbow: She exhibits decreased range of motion and swelling. She exhibits no deformity. tenderness found. Radial head tenderness noted.  Neurological: She is alert. She has normal strength. She displays normal reflexes. No sensory deficit.  Skin: Skin is warm and dry.  Psychiatric: She has a normal mood and affect.  ED Course  Procedures (including critical care time)  Labs Reviewed - No data to display No results found.      1. Radial head fracture, closed       MDM  xrays reviewed with patient.  She was placed in a sugar tong splint and sling applied by RN. I examined her after application and she displayed FROM of fingers,  Less than 3 sec cap refill.  Prescribed oxycodone with #2 given prior to dc home.  Pt to call ortho in am for appt time.   Spoke with Dr Hilda Lias - will see pt in am.       Burgess Amor, PA 10/18/11 1836

## 2011-10-21 NOTE — ED Provider Notes (Signed)
Medical screening examination/treatment/procedure(s) were performed by non-physician practitioner and as supervising physician I was immediately available for consultation/collaboration.   Shelda Jakes, MD 10/21/11 2212

## 2012-05-05 ENCOUNTER — Encounter (HOSPITAL_COMMUNITY): Payer: Self-pay | Admitting: Emergency Medicine

## 2012-05-05 ENCOUNTER — Emergency Department (HOSPITAL_COMMUNITY)
Admission: EM | Admit: 2012-05-05 | Discharge: 2012-05-06 | Disposition: A | Discharge status: Other Acute Inpatient Hospital | Payer: MEDICAID | Source: Home / Self Care | Attending: Emergency Medicine | Admitting: Emergency Medicine

## 2012-05-05 DIAGNOSIS — Z9119 Patient's noncompliance with other medical treatment and regimen: Secondary | ICD-10-CM | POA: Insufficient documentation

## 2012-05-05 DIAGNOSIS — F319 Bipolar disorder, unspecified: Secondary | ICD-10-CM

## 2012-05-05 DIAGNOSIS — F603 Borderline personality disorder: Secondary | ICD-10-CM | POA: Insufficient documentation

## 2012-05-05 DIAGNOSIS — F1910 Other psychoactive substance abuse, uncomplicated: Secondary | ICD-10-CM

## 2012-05-05 DIAGNOSIS — R45851 Suicidal ideations: Secondary | ICD-10-CM | POA: Insufficient documentation

## 2012-05-05 DIAGNOSIS — F1410 Cocaine abuse, uncomplicated: Secondary | ICD-10-CM | POA: Insufficient documentation

## 2012-05-05 DIAGNOSIS — F602 Antisocial personality disorder: Secondary | ICD-10-CM

## 2012-05-05 DIAGNOSIS — Z8739 Personal history of other diseases of the musculoskeletal system and connective tissue: Secondary | ICD-10-CM | POA: Insufficient documentation

## 2012-05-05 DIAGNOSIS — Z91199 Patient's noncompliance with other medical treatment and regimen due to unspecified reason: Secondary | ICD-10-CM | POA: Insufficient documentation

## 2012-05-05 DIAGNOSIS — R443 Hallucinations, unspecified: Secondary | ICD-10-CM | POA: Insufficient documentation

## 2012-05-05 DIAGNOSIS — F17200 Nicotine dependence, unspecified, uncomplicated: Secondary | ICD-10-CM | POA: Insufficient documentation

## 2012-05-05 DIAGNOSIS — Z8541 Personal history of malignant neoplasm of cervix uteri: Secondary | ICD-10-CM | POA: Insufficient documentation

## 2012-05-05 DIAGNOSIS — Z79899 Other long term (current) drug therapy: Secondary | ICD-10-CM | POA: Insufficient documentation

## 2012-05-05 LAB — CBC WITH DIFFERENTIAL/PLATELET
Basophils Absolute: 0 10*3/uL (ref 0.0–0.1)
Eosinophils Relative: 1 % (ref 0–5)
Lymphocytes Relative: 23 % (ref 12–46)
Lymphs Abs: 2.5 10*3/uL (ref 0.7–4.0)
MCV: 89.4 fL (ref 78.0–100.0)
Neutro Abs: 7.7 10*3/uL (ref 1.7–7.7)
Neutrophils Relative %: 70 % (ref 43–77)
Platelets: 330 10*3/uL (ref 150–400)
RBC: 5.2 MIL/uL — ABNORMAL HIGH (ref 3.87–5.11)
RDW: 13.9 % (ref 11.5–15.5)
WBC: 11 10*3/uL — ABNORMAL HIGH (ref 4.0–10.5)

## 2012-05-05 NOTE — ED Notes (Signed)
Patient presents to ER via [facility] with c/o hearing voices telling her to die.  Patient states had thoughts of hurting herself earlier today.

## 2012-05-06 ENCOUNTER — Inpatient Hospital Stay (HOSPITAL_COMMUNITY)
Admission: EM | Admit: 2012-05-06 | Discharge: 2012-05-09 | DRG: 885 | Disposition: A | Discharge status: Home / Self Care | Payer: MEDICAID | Source: Intra-hospital | Attending: Psychiatry | Admitting: Psychiatry

## 2012-05-06 ENCOUNTER — Encounter (HOSPITAL_COMMUNITY): Payer: Self-pay | Admitting: *Deleted

## 2012-05-06 DIAGNOSIS — Z79899 Other long term (current) drug therapy: Secondary | ICD-10-CM

## 2012-05-06 DIAGNOSIS — R45851 Suicidal ideations: Secondary | ICD-10-CM

## 2012-05-06 DIAGNOSIS — F603 Borderline personality disorder: Secondary | ICD-10-CM | POA: Diagnosis present

## 2012-05-06 DIAGNOSIS — F3160 Bipolar disorder, current episode mixed, unspecified: Principal | ICD-10-CM | POA: Diagnosis present

## 2012-05-06 DIAGNOSIS — F3130 Bipolar disorder, current episode depressed, mild or moderate severity, unspecified: Secondary | ICD-10-CM

## 2012-05-06 DIAGNOSIS — F1410 Cocaine abuse, uncomplicated: Secondary | ICD-10-CM | POA: Diagnosis present

## 2012-05-06 HISTORY — DX: Malignant (primary) neoplasm, unspecified: C80.1

## 2012-05-06 LAB — COMPREHENSIVE METABOLIC PANEL
ALT: 9 U/L (ref 0–35)
AST: 17 U/L (ref 0–37)
Alkaline Phosphatase: 92 U/L (ref 39–117)
CO2: 27 mEq/L (ref 19–32)
Chloride: 95 mEq/L — ABNORMAL LOW (ref 96–112)
GFR calc Af Amer: >90 mL/min (ref >90)
GFR calc non Af Amer: 78 mL/min — ABNORMAL LOW (ref >90)
Glucose, Bld: 91 mg/dL (ref 70–99)
Potassium: 3.4 mEq/L — ABNORMAL LOW (ref 3.5–5.1)
Sodium: 134 mEq/L — ABNORMAL LOW (ref 135–145)
Total Bilirubin: 0.4 mg/dL (ref 0.3–1.2)

## 2012-05-06 LAB — LITHIUM LEVEL: Lithium Lvl: 0.71 mEq/L — ABNORMAL LOW (ref 0.80–1.40)

## 2012-05-06 LAB — RAPID URINE DRUG SCREEN, HOSP PERFORMED
Barbiturates: NOT DETECTED
Benzodiazepines: NOT DETECTED

## 2012-05-06 LAB — ETHANOL: Alcohol, Ethyl (B): <11 mg/dL (ref 0–11)

## 2012-05-06 MED ORDER — TRAZODONE HCL 50 MG PO TABS
ORAL_TABLET | ORAL | Status: AC
  Filled 2012-05-06: qty 4

## 2012-05-06 MED ORDER — RISPERIDONE 1 MG PO TABS
1.0000 mg | ORAL_TABLET | Freq: Two times a day (BID) | ORAL | Status: DC
  Administered 2012-05-06 (×2): 1 mg via ORAL
  Filled 2012-05-06 (×6): qty 1

## 2012-05-06 MED ORDER — NICOTINE 14 MG/24HR TD PT24
14.0000 mg | MEDICATED_PATCH | Freq: Every day | TRANSDERMAL | Status: DC
  Administered 2012-05-07 – 2012-05-09 (×3): 14 mg via TRANSDERMAL
  Filled 2012-05-06 (×6): qty 1

## 2012-05-06 MED ORDER — RISPERIDONE 1 MG PO TABS
ORAL_TABLET | ORAL | Status: AC
  Filled 2012-05-06: qty 1

## 2012-05-06 MED ORDER — TRAZODONE HCL 50 MG PO TABS
200.0000 mg | ORAL_TABLET | Freq: Every day | ORAL | Status: DC
  Administered 2012-05-06: 200 mg via ORAL
  Filled 2012-05-06: qty 2
  Filled 2012-05-06: qty 4
  Filled 2012-05-06: qty 2

## 2012-05-06 MED ORDER — LITHIUM CARBONATE 300 MG PO CAPS
ORAL_CAPSULE | ORAL | Status: AC
  Filled 2012-05-06: qty 1

## 2012-05-06 MED ORDER — TRAZODONE HCL 100 MG PO TABS
200.0000 mg | ORAL_TABLET | Freq: Every day | ORAL | Status: DC
  Administered 2012-05-07 – 2012-05-08 (×3): 200 mg via ORAL
  Filled 2012-05-06: qty 2
  Filled 2012-05-06: qty 6
  Filled 2012-05-06 (×5): qty 2

## 2012-05-06 MED ORDER — AMPHETAMINE-DEXTROAMPHETAMINE 10 MG PO TABS
15.0000 mg | ORAL_TABLET | Freq: Three times a day (TID) | ORAL | Status: DC
  Administered 2012-05-07: 15 mg via ORAL
  Filled 2012-05-06: qty 2

## 2012-05-06 MED ORDER — LITHIUM CARBONATE ER 450 MG PO TBCR
450.0000 mg | EXTENDED_RELEASE_TABLET | Freq: Two times a day (BID) | ORAL | Status: DC
  Administered 2012-05-07: 450 mg via ORAL
  Filled 2012-05-06 (×4): qty 1

## 2012-05-06 MED ORDER — MAGNESIUM HYDROXIDE 400 MG/5ML PO SUSP: 30.0000 mL | Freq: Every day | ORAL | Status: DC | PRN

## 2012-05-06 MED ORDER — ALUM & MAG HYDROXIDE-SIMETH 200-200-20 MG/5ML PO SUSP: 30.0000 mL | ORAL | Status: DC | PRN

## 2012-05-06 MED ORDER — ZOLPIDEM TARTRATE 5 MG PO TABS
10.0000 mg | ORAL_TABLET | Freq: Once | ORAL | Status: AC
  Administered 2012-05-06: 10 mg via ORAL
  Filled 2012-05-06: qty 2

## 2012-05-06 MED ORDER — LITHIUM CARBONATE 300 MG PO CAPS
450.0000 mg | ORAL_CAPSULE | Freq: Two times a day (BID) | ORAL | Status: DC
  Administered 2012-05-06 (×2): 450 mg via ORAL
  Filled 2012-05-06 (×5): qty 1

## 2012-05-06 MED ORDER — PNEUMOCOCCAL VAC POLYVALENT 25 MCG/0.5ML IJ INJ
0.5000 mL | INJECTION | INTRAMUSCULAR | Status: AC
  Administered 2012-05-07: 0.5 mL via INTRAMUSCULAR

## 2012-05-06 MED ORDER — ACETAMINOPHEN 325 MG PO TABS: 650.0000 mg | ORAL_TABLET | Freq: Four times a day (QID) | ORAL | Status: DC | PRN

## 2012-05-06 MED ORDER — CHLORDIAZEPOXIDE HCL 25 MG PO CAPS: 25.0000 mg | ORAL_CAPSULE | Freq: Four times a day (QID) | ORAL | Status: DC | PRN

## 2012-05-06 MED ORDER — LITHIUM CARBONATE 150 MG PO CAPS
ORAL_CAPSULE | ORAL | Status: AC
  Filled 2012-05-06: qty 1

## 2012-05-06 MED ORDER — POTASSIUM CHLORIDE CRYS ER 20 MEQ PO TBCR
20.0000 meq | EXTENDED_RELEASE_TABLET | Freq: Two times a day (BID) | ORAL | Status: AC
  Administered 2012-05-07 – 2012-05-08 (×4): 20 meq via ORAL
  Filled 2012-05-06 (×6): qty 1

## 2012-05-06 NOTE — Clinical Social Work Psych Note (Signed)
THE PATIENT HAS BEEN ACCEPTED TO [facility] BY J LORD NP TO THE SERVICE OF DR Boykin Peek AUTHORIZED THROUGH [facility]  SUPPORT PAPER WORK COMPLETED  DR BEATON INFORMED OF PENDING TRANSFER.

## 2012-05-06 NOTE — ED Notes (Signed)
Report given to [facility]  

## 2012-05-06 NOTE — ED Notes (Signed)
Samson Frederic from [facility] called and said that the bed there will not be ready until tomorrow. Charge RN made aware.

## 2012-05-06 NOTE — Tx Team (Signed)
Initial Interdisciplinary Treatment Plan  PATIENT STRENGTHS: (choose at least two) Ability for insight Active sense of humor Average or above average intelligence Capable of independent living Communication skills General fund of knowledge Motivation for treatment/growth Physical Health Special hobby/interest Work skills  PATIENT STRESSORS: Financial difficulties Marital or family conflict Medication change or noncompliance Substance abuse Traumatic event   PROBLEM LIST: Problem List/Patient Goals Date to be addressed Date deferred Reason deferred Estimated date of resolution  "I just wanna get stable back on my meds" 05/06/12     "And get my anxiety and stress levels down" 05/06/12           depression 05/06/12     Increased risk for suicide 05/06/12     Substance abuse 05/06/12                        DISCHARGE CRITERIA:  Ability to meet basic life and health needs Adequate post-discharge living arrangements Improved stabilization in mood, thinking, and/or behavior Medical problems require only outpatient monitoring Motivation to continue treatment in a less acute level of care Need for constant or close observation no longer present Reduction of life-threatening or endangering symptoms to within safe limits Safe-care adequate arrangements made Verbal commitment to aftercare and medication compliance Withdrawal symptoms are absent or subacute and managed without 24-hour nursing intervention  PRELIMINARY DISCHARGE PLAN: Attend 12-step recovery group Participate in family therapy Placement in alternative living arrangements Return to previous living arrangement  PATIENT/FAMIILY INVOLVEMENT: This treatment plan has been presented to and reviewed with the patient, Sandra Santos, and/or family member.  The patient and family have been given the opportunity to ask questions and make suggestions.  [name] 05/06/2012, 10:30 PM

## 2012-05-06 NOTE — BH Assessment (Addendum)
Assessment Note   Sandra Santos is an 47 y.o. female. The patient came to the ED last night, with complaints of auditory hallucinations and suicidal thoughts. She reports that "voices" tell her to kill herself. She continues to feel suicidal but does not have a plan. She has a long history of mental illness with several inpatient admissions. She is currently followed by [facility]?Dr Rosalia Hammers for medications. She is not taking her medications as prescribed. She also has begun to abuse cocaine and Xanax, She has been using for the last 3 days. She is manic in her behaviors at  Admission but appears more depressed this am.  She was  seen for tele-psych and the recommendation was inpatient care. Dr Radford Pax is in agreement with the inpatient treatment. The patient lives alone and cannot contract for safety. Patient referred to [facility].  Axis I: Bipolar, Depressed Axis II: Deferred Axis III:  Past Medical History  Diagnosis Date  . Bipolar 1 disorder   . Borderline personality disorder   . Arthritis    Axis IV: economic problems, housing problems, problems with access to health care services and problems with primary support group Axis V: 31-40 impairment in reality testing  Past Medical History:  Past Medical History  Diagnosis Date  . Bipolar 1 disorder   . Borderline personality disorder   . Arthritis     Past Surgical History  Procedure Laterality Date  . Tonsillectomy    . Abdominal hysterectomy    . Ovarian cyst removal    . Cesarean section      Family History:  Family History  Problem Relation Age of Onset  . Cancer Mother   . Diabetes Mother   . Asthma Mother   . Hypertension Mother   . Heart attack Father   . COPD Father   . Hypertension Father   . Arthritis    . Lung disease      Social History:  reports that she has been smoking Cigarettes.  She has a 15 pack-year smoking history. She has never used smokeless tobacco. She reports that  drinks alcohol. She reports that  she uses illicit drugs (Cocaine).  Additional Social History:     CIWA: CIWA-Ar BP: 137/90 mmHg Pulse Rate: 88 COWS:    Allergies: No Known Allergies  Home Medications:  (Not in a hospital admission)  OB/GYN Status:  No LMP recorded. Patient has had a hysterectomy.  General Assessment Data Location of Assessment: [facility] ACT Assessment: Yes Living Arrangements: Alone Can pt return to current living arrangement?: Yes Admission Status: Voluntary Is patient capable of signing voluntary admission?: Yes Transfer from: Acute Hospital Referral Source: MD  Education Status Is patient currently in school?: No  Risk to self Suicidal Ideation: Yes-Currently Present Suicidal Intent: Yes-Currently Present Is patient at risk for suicide?: Yes Suicidal Plan?: No Access to Means: No What has been your use of drugs/alcohol within the last 12 months?: using cocaine and Xanax for 3 days Previous Attempts/Gestures: No How many times?: 0 Other Self Harm Risks: continues to use drugs and not take medications as prescribed Triggers for Past Attempts: None known Intentional Self Injurious Behavior: None Family Suicide History: No Recent stressful life event(s): Conflict (Comment);Recent negative physical changes;Other (Comment) (resumed drug use; not taking medicaqtions as prescribed) Persecutory voices/beliefs?: No Depression: Yes Depression Symptoms: Insomnia;Tearfulness;Isolating;Loss of interest in usual pleasures;Feeling worthless/self pity Substance abuse history and/or treatment for substance abuse?: Yes Suicide prevention information given to non-admitted patients: Not applicable  Risk to Others Homicidal Ideation: No Thoughts of Harm to Others: No Current Homicidal Intent: No Current Homicidal Plan: No Access to Homicidal Means: No History of harm to others?: No Assessment of Violence: None Noted Does patient have access to weapons?: No Criminal Charges Pending?: No Does  patient have a court date: No  Psychosis Hallucinations: Auditory;With command (tell her to kill self) Delusions: None noted  Mental Status Report Appear/Hygiene: Disheveled Eye Contact: Fair Motor Activity: Freedom of movement;Restlessness Speech: Rapid Level of Consciousness: Drowsy;Restless Mood: Sad Affect: Sad Anxiety Level: Moderate Thought Processes: Coherent Judgement: Unimpaired Orientation: Person;Place;Time Obsessive Compulsive Thoughts/Behaviors: Minimal  Cognitive Functioning Concentration: Decreased Memory: Recent Intact;Remote Intact IQ: Average Insight: Fair Impulse Control: Poor Appetite: Fair Weight Loss: 0 Weight Gain: 0 Sleep: Decreased Total Hours of Sleep:  (not sleeping ) Vegetative Symptoms: Decreased grooming  ADLScreening [facility] Assessment Services) Patient's cognitive ability adequate to safely complete daily activities?: Yes Patient able to express need for assistance with ADLs?: Yes Independently performs ADLs?: Yes (appropriate for developmental age)  Abuse/Neglect [facility]) Physical Abuse: Denies Verbal Abuse: Denies Sexual Abuse: Denies  Prior Inpatient Therapy Prior Inpatient Therapy: Yes Prior Therapy Facilty/Provider(s): [facility] Reason for Treatment: depression; SI  Prior Outpatient Therapy Prior Outpatient Therapy: Yes Prior Therapy Dates: current Prior Therapy Facilty/Provider(s): [facility]Dr Ray Reason for Treatment: medications  ADL Screening (condition at time of admission) Patient's cognitive ability adequate to safely complete daily activities?: Yes Patient able to express need for assistance with ADLs?: Yes Independently performs ADLs?: Yes (appropriate for developmental age)       Abuse/Neglect Assessment (Assessment to be complete while patient is alone) Physical Abuse: Denies Verbal Abuse: Denies Sexual Abuse: Denies Values / Beliefs Cultural Requests During Hospitalization: None Spiritual Requests During  Hospitalization: None        Additional Information 1:1 In Past 12 Months?: No CIRT Risk: No Elopement Risk: No Does patient have medical clearance?: Yes     Disposition: PATIENT ACCEPTED AS A VOLUNTARY  ADMISSION TO [facility]. JAMMISON LORD  NP ACCEPTED TO THE SERVICE OF DR Claudine Mouton  TRANSPORT WILL BE [facility]. DR BEATON IN AGREEMENT WITH DISPOSITION. Disposition Initial Assessment Completed for this Encounter: Yes Disposition of Patient: Inpatient treatment program Type of inpatient treatment program: Adult  On Site Evaluation by:   Reviewed with Physician:     Jearld Pies 05/06/2012 10:10 AM

## 2012-05-06 NOTE — ED Notes (Signed)
Patient states "My husband lives in the house beside me and I keep hearing these voices telling me to die but I think it might be my husband doing this to me."

## 2012-05-06 NOTE — ED Notes (Signed)
Pt transferred to behavorial health via [facility].  Personal property retrieved from security, signed for by [facility] staff.

## 2012-05-06 NOTE — ED Notes (Signed)
Lithium 450mg  BID per patient is normal lithium dosage.       Jones Skene, MD 05/06/12 [phone]

## 2012-05-06 NOTE — ED Notes (Signed)
Called [facility], ACT had pt room number. [facility] reported room number ACT team was given was a female bed and that pt would most likely not get a bed until tomorrow but that [facility] would continue to work on placement. Pt aware and verbalized understanding.

## 2012-05-06 NOTE — ED Provider Notes (Signed)
History     CSN: [id number]  Arrival date & time 05/05/12  2306   First MD Initiated Contact with Patient 05/05/12 2344      Chief Complaint  Patient presents with  . V70.1    HPI Sandra Santos is a 47 y.o. female history of bipolar 1 disorder and has been taking risperidone as well as trazodone is supposed to be taking lithium twice daily however has been hearing voices since Saturday. Patient knowledge is that she has not slept in 2-3 days, she is using cocaine lots of Xanax, a little alcohol has been having frequent blackouts of Xanax usage. She says that she's only been taking lithium once a day for a while now because she forgets it in the morning. She is hearing voices that say "die, die" and "awful crazy shit."  Patient is been having trouble with significant other that started about the same time as the voices did.  Symptoms are severe, constant, worsening.  Past Medical History  Diagnosis Date  . Bipolar 1 disorder   . Borderline personality disorder   . Arthritis     Past Surgical History  Procedure Laterality Date  . Tonsillectomy    . Abdominal hysterectomy    . Ovarian cyst removal    . Cesarean section      Family History  Problem Relation Age of Onset  . Cancer Mother   . Diabetes Mother   . Asthma Mother   . Hypertension Mother   . Heart attack Father   . COPD Father   . Hypertension Father   . Arthritis    . Lung disease      History  Substance Use Topics  . Smoking status: Current Every Day Smoker -- 0.50 packs/day for 30 years    Types: Cigarettes  . Smokeless tobacco: Never Used  . Alcohol Use: Yes     Comment: rarely    OB History   Grav Para Term Preterm Abortions TAB SAB Ect Mult Living   5 3 2 1 2 2    3       Review of Systems At least 10pt or greater review of systems completed and are negative except where specified in the HPI.  Allergies  Review of patient's allergies indicates no known allergies.  Home Medications    Current Outpatient Rx  Name  Route  Sig  Dispense  Refill  . risperiDONE (RISPERDAL) 1 MG tablet   Oral   Take 1 mg by mouth 2 (two) times daily.         . traZODone (DESYREL) 100 MG tablet   Oral   Take 200 mg by mouth at bedtime.           BP 137/90  Pulse 88  Resp 18  SpO2 100%  Physical Exam  Nursing notes reviewed.  Electronic medical record reviewed. VITAL SIGNS:   Filed Vitals:   05/06/12 0052  BP: 137/90  Pulse: 88  Resp: 18  SpO2: 100%   CONSTITUTIONAL: Awake, oriented, appears non-toxic HENT: Atraumatic, normocephalic, oral mucosa pink and moist, airway patent. Nares patent without drainage. External ears normal. EYES: Conjunctiva clear, EOMI, PERRLA NECK: Trachea midline, non-tender, supple CARDIOVASCULAR: Normal heart rate, Normal rhythm, No murmurs, rubs, gallops PULMONARY/CHEST: Clear to auscultation, no rhonchi, wheezes, or rales. Symmetrical breath sounds. Non-tender. ABDOMINAL: Non-distended, soft, non-tender - no rebound or guarding.  BS normal. NEUROLOGIC: Non-focal, moving all four extremities, no gross sensory or motor deficits. EXTREMITIES: No clubbing, cyanosis, or  edema SKIN: Warm, Dry, No erythema, No rash Psychiatric: Depressed mood, blunted affect, slightly anxious, appears to be responding to internal stimuli, no delusions, endorses suicidal ideation without concrete plan ED Course  Procedures (including critical care time)  Labs Reviewed  CBC WITH DIFFERENTIAL - Abnormal; Notable for the following:    WBC 11.0 (*)    RBC 5.20 (*)    Hemoglobin 16.2 (*)    HCT 46.5 (*)    All other components within normal limits  COMPREHENSIVE METABOLIC PANEL - Abnormal; Notable for the following:    Sodium 134 (*)    Potassium 3.4 (*)    Chloride 95 (*)    BUN 4 (*)    GFR calc non Af Amer 78 (*)    All other components within normal limits  URINE RAPID DRUG SCREEN (HOSP PERFORMED) - Abnormal; Notable for the following:    Cocaine POSITIVE  (*)    All other components within normal limits  LITHIUM LEVEL - Abnormal; Notable for the following:    Lithium Lvl 0.71 (*)    All other components within normal limits  ETHANOL   No results found.   1. Bipolar disorder, unspecified   2. Antisocial personality disorder   3. Polysubstance abuse       MDM  Sandra Santos is a 47 y.o. female  History of bipolar 1 acknowledges medication noncompliance and prescription and cocaine abuse presenting with complaints of suicidal ideation and auditory hallucinations. Patient has no concrete suicidal plan, however I think this patient represents a threat to herself with her continued drug use and medication noncompliance-she may be in a manic phase at this point, we'll consult telepsychiatry for recommendation on inpatient versus outpatient therapy.   Consultation per Dr.Penalver recommends inpatient therapy. Psych hold orders ordered, ACT team to see in morning for placement.         Jones Skene, MD 05/06/12 4696

## 2012-05-06 NOTE — BH Assessment (Signed)
[facility] Assessment Progress Note      Consulted Nanine Means NP re admission for this [facility] client to [facility]. She has been accepted to [facility].

## 2012-05-06 NOTE — ED Notes (Addendum)
ACT team coordinated with [facility] bed placement. Pt has a bed but cannot arrive to [facility] before 7pm per [facility]. Pt aware and verbalized understanding.Secretary Aware.

## 2012-05-07 DIAGNOSIS — F1410 Cocaine abuse, uncomplicated: Secondary | ICD-10-CM

## 2012-05-07 MED ORDER — ENSURE COMPLETE PO LIQD
237.0000 mL | Freq: Two times a day (BID) | ORAL | Status: DC
  Administered 2012-05-08: 237 mL via ORAL

## 2012-05-07 MED ORDER — SERTRALINE HCL 25 MG PO TABS
25.0000 mg | ORAL_TABLET | Freq: Every day | ORAL | Status: DC
  Administered 2012-05-07 – 2012-05-09 (×3): 25 mg via ORAL
  Filled 2012-05-07 (×2): qty 1
  Filled 2012-05-07: qty 3
  Filled 2012-05-07 (×4): qty 1

## 2012-05-07 MED ORDER — ADULT MULTIVITAMIN W/MINERALS CH
1.0000 | ORAL_TABLET | Freq: Every day | ORAL | Status: DC
  Administered 2012-05-07 – 2012-05-09 (×3): 1 via ORAL
  Filled 2012-05-07 (×5): qty 1

## 2012-05-07 MED ORDER — IBUPROFEN 200 MG PO TABS: 400.0000 mg | ORAL_TABLET | Freq: Four times a day (QID) | ORAL | Status: DC | PRN

## 2012-05-07 MED ORDER — LITHIUM CARBONATE ER 300 MG PO TBCR
600.0000 mg | EXTENDED_RELEASE_TABLET | Freq: Two times a day (BID) | ORAL | Status: DC
  Administered 2012-05-07 – 2012-05-09 (×4): 600 mg via ORAL
  Filled 2012-05-07: qty 12
  Filled 2012-05-07 (×3): qty 2
  Filled 2012-05-07: qty 12
  Filled 2012-05-07 (×5): qty 2
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
## 2012-05-07 NOTE — Progress Notes (Signed)
Patient ID: Sandra Santos, female   DOB: [birth year], 47 y.o.   MRN: [id number]  D: Pt was pleasant, cooperative, and brighter today than previous day. Stated that she walked out of group with the SW, because she didn't feel comfortable discussing her problems in front of a group of people she doesn't know. Stated, "It's just too soon and I would be crying so much". Stated that she spoke to the SW and her peers in order to apologize. "I wasn't trying to be rude".  Informed the writer that changes were made to her meds, including discontinuing her adderall.  Stated, "I'm not gonna worry about that. It just makes it more difficult for me to concentrate when I don't have it'. Pt stated, "but I'm gonna be all right".   A:  Support and encouragement was offered. 15 min checks continued for safety.  R: Pt remains safe.

## 2012-05-07 NOTE — [facility] Group Notes (Signed)
[facility] LCSW Group Therapy      Emotional Regulation 1:15 - 2:30 PM           05/07/2012 3:40 PM  Type of Therapy:  Group Therapy  Participation Level:  Did Not Attend.  Patient came to group but did not stay for the session.  Wynn Banker 05/07/2012, 3:40 PM

## 2012-05-07 NOTE — [facility] Group Notes (Signed)
[facility] LCSW Aftercare Discharge Planning Group Note   05/07/2012 10:15 AM  Participation Quality:  Appropriate  Mood/Affect:  Appropriate, Depressed and Flat  Depression Rating:  5  Anxiety Rating:  10  Thoughts of Suicide:  Yes  Will you contract for safety?   Yes  Current AVH:  No  Plan for Discharge/Comments:  Patient advised of admitting to the hospital with SI/HI.  She endorsed SI but able to contract for safety.  She also endorsed AH prior to admission but currently denies.  Patient reports she is seen outpatient by [facility] in [place]. She plans to return to her home in [place].  Transportation Means: Patient reports she does not have transportation home.  Supports:  Limited support system.  Pryce Folts, Joesph July
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
## 2012-05-07 NOTE — Progress Notes (Signed)
Recreation Therapy Notes  Date: 04.23.2014 Time: 3:10pm Location: [facility] Courtyard  Group Topic/Focus: Problem Solving, Communication, Team Building   Participation Level:  Active  Participation Quality:  Appropriate  Affect:  Flat  Cognitive:  Appropriate  Additional Comments: Activity: River Cross ; Explanation: LRT drew two lines approximately 10 feet apart on the courtyard. Patients were given piece of paper and were instruction to use the paper to cross the imaginary river between the two lines. Patient completed 3 rounds. Rounds 1 was conducted with 5 pieces of paper, round 2 with 4 pieces of paper. Rounds 1 & 2 patients were instructed that they could cross the river individually. Round 3 consisted of patients crossing collectively. Patients were given 5 pieces of paper to complete the last round.   Patient listened to the instructions for the group activity. As the activity was starting patient threw hands up in the air and walked away from the group. LRT asked patient if she was choosing not to participate, patient shook head "yes." Patient observed group session, but did not participate in group activity or group discussion on benefits of effective communication, problem solving and team building.   Sandra Santos, Sandra Santos  Jearl Klinefelter 05/07/2012 4:06 PM

## 2012-05-07 NOTE — Progress Notes (Signed)
Nutrition Brief Note  Patient identified on the Malnutrition Screening Tool (MST) Report  Body mass index is 20.15 kg/(m^2). Patient meets criteria for wnl based on current BMI.   Current diet order is regular, patient is consuming approximately fair% of meals at this time. Labs and medications reviewed.   Patient reports very poor intake for the last 1 month.  Usually ate only 1 meal daily.  UBW of 135-140 lbs 3 months ago and now down to 112 lbs.  Ht:  5'2".    Will add MVI daily and Ensure Complete po BID, each supplement provides 350 kcal and 13 grams of protein.   No further nutrition interventions warranted at this time. If futher nutrition issues arise, please consult RD.   Oran Rein, RD, LDN Clinical Inpatient Dietitian Pager:  [phone] Weekend and after hours pager:  [phone]

## 2012-05-07 NOTE — Tx Team (Signed)
Interdisciplinary Treatment Plan Update   Date Reviewed:  05/07/2012  Time Reviewed:  9:57 AM  Progress in Treatment:   Attending groups: Yes Participating in groups: Yes Taking medication as prescribed: Yes  Tolerating medication: Yes Family/Significant other contact made: No, patient is declining family contact.  Patient understands diagnosis: Yes  Discussing patient identified problems/goals with staff: Yes Medical problems stabilized or resolved: Yes Denies suicidal/homicidal ideation: Yes, she reports she is no longer endorsing SI or HI. Patient has not harmed self or others: Yes  For review of initial/current patient goals, please see plan of care.  Estimated Length of Stay:  2-4 days  Reasons for Continued Hospitalization:  Anxiety Depression Medication stabilization  New Problems/Goals identified:    Discharge Plan or Barriers:   Home with outpatient follow up  Additional Comments:  Patient reports admitting to the hospital with SI/HI.  She continues to endorse SI but contract for safety.  She is denying HI at this time but endorses a lot of rage.  She reports she was having AH prior to admission but not at this time.She is followed outpatient by [facility] in [place].  Attendees:  Patient: Sandra Santos 05/07/2012 9:57 AM   Signature: Patrick North, MD 05/07/2012 9:57 AM  Signature:Zahari Arlana Pouch, RN 05/07/2012 9:57 AM  Signature:Britney Chales Abrahams, RN 05/07/2012 9:57 AM  Signature: Tinnie Gens, RN 05/07/2012 9:57 AM  Signature:   05/07/2012 9:57 AM  Signature:  Juline Patch, LCSW 05/07/2012 9:57 AM  Signature:  05/07/2012 9:57 AM  Signature: Liliane Bade, BSW 05/07/2012 9:57 AM  Signature: Fransisca Kaufmann, [facility] 05/07/2012 9:57 AM  Signature:    Signature:    Signature:      Scribe for Treatment Team:   Juline Patch,  05/07/2012 9:57 AM

## 2012-05-07 NOTE — Progress Notes (Signed)
  D) Patient assertive  and cooperative upon my assessment. Patient completed Patient Self Inventory, reports slept "fair," and  appetite is "improving." Patient rates depression as   6/10, patient rates hopeless feelings as  6/10. Patient endorses "off and on" SI, contracts verbally for safety with RN. Patient denies HI, denies A/V hallucinations. Patient concerned about injury to left hand,  fifth finger. Patient states "I think I broke my pinky a couple of weeks ago but I never had it looked at."   A) Patient offered support and encouragement, patient encouraged to discuss feelings/concerns with staff. Patient verbalized understanding. Patient monitored Q15 minutes for safety. Patient met with MD  to discuss today's goals and plan of care.  R) Patient visible in milieu, attending groups in day room and meals in dining room. Patient appropriate with staff and peers.   Patient taking medications as ordered. Will continue to monitor.
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
## 2012-05-07 NOTE — Progress Notes (Signed)
Subjective: Patient reports mild pain and swelling of left pinky finger. She is unsure of what type of injury happened and is unable to recall specific details. Patient stated "I just remember being very agitated. I was probably slamming things around my house. It's not really bothering me that much. I did not mention it over at the hospital."  Objective: Small amount of edema and erythema to left pinky finger. Very mild tenderness to palpation. Normal ROM to finger. Other digits of both hands WNL.  Assessment: Unspecified injury to left fifth finger. Plan:  Order motrin 400 mg po every six hours as needed for mild pain and inflammation. Ice area as needed. Monitor for improvement in symptoms per patient report on a daily basis. Instructed to notify staff if symptoms begin to worsen.

## 2012-05-08 DIAGNOSIS — F3160 Bipolar disorder, current episode mixed, unspecified: Principal | ICD-10-CM

## 2012-05-08 DIAGNOSIS — F1910 Other psychoactive substance abuse, uncomplicated: Secondary | ICD-10-CM
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
## 2012-05-08 NOTE — [facility] Group Notes (Signed)
[facility] LCSW Aftercare Discharge Planning Group Note   05/08/2012 10:48 AM  Participation Quality:  Appropiate  Mood/Affect:  Depressed  Depression Rating:  2  Anxiety Rating:  4  Thoughts of Suicide:  No  Will you contract for safety?   NA  Current AVH:  No  Plan for Discharge/Comments:  Patient reports feeling better today.  She shared she has talked with other patients and that has been helpful.  Transportation Means: Patient may need assistance with transporation  Supports:  Limited support system.  Quenten Nawaz, Joesph July
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
## 2012-05-08 NOTE — Progress Notes (Signed)
  D) Patient pleasant and cooperative upon my assessment. Patient completed Patient Self Inventory, reports slept "fair," and  appetite is "improving." Patient rates depression as   2/10, patient rates hopeless feelings as  2/10. Patient denies SI/HI, denies A/V hallucinations. Patient states "I am thinking a lot more clearly today, I'm not sure that everything at home was the way I thought it was."   A) Patient offered support and encouragement, patient encouraged to discuss feelings/concerns with staff. Patient verbalized understanding. Patient monitored Q15 minutes for safety. Patient met with MD  to discuss today's goals and plan of care.  R) Patient visible in milieu, attending groups in day room and meals in dining room. Patient appropriate with staff and peers.   Patient taking medications as ordered. Patient insightful with a plan to "stay on meds, find a support system and use it." Will continue to monitor.

## 2012-05-08 NOTE — Progress Notes (Signed)
Adult Psychoeducational Group Note  Date:  05/08/2012 Time:  1100  Group Topic/Focus:  Self Esteem Action Plan:   The focus of this group is to help patients create a plan to continue to build self-esteem after discharge.  Participation Level:  Minimal  Participation Quality:  Attentive  Affect:  Appropriate  Cognitive:  Alert  Insight: Good  Engagement in Group:  Limited  Modes of Intervention:  Discussion, Education and Socialization  Additional Comments:  Patient late for group, attended last five minutes of group session.   Kennie, Kar[place]etian 05/08/2012, 2:05 PM

## 2012-05-09 MED ORDER — TRAZODONE HCL 100 MG PO TABS: 200.0000 mg | ORAL_TABLET | Freq: Every day | ORAL | Status: AC

## 2012-05-09 MED ORDER — SERTRALINE HCL 25 MG PO TABS: 25.0000 mg | ORAL_TABLET | Freq: Every day | ORAL | Status: DC

## 2012-05-09 MED ORDER — LITHIUM CARBONATE ER 300 MG PO TBCR: 600.0000 mg | EXTENDED_RELEASE_TABLET | Freq: Two times a day (BID) | ORAL | Status: AC

## 2012-05-09 NOTE — Progress Notes (Addendum)
[facility] Adult Case Management Discharge Plan :  Will you be returning to the same living situation after discharge: Yes, patient will return to her home. At discharge, do you have transportation home?:  No, patient will need assistance with a taxi voucher. Do you have the ability to pay for your medications:Yes,  Patient has Medicaid.  Release of information consent forms completed and in the chart;  Patient's signature needed at discharge.  Patient to Follow up at:  [facility]   Tuesday, May 13, 2012 at 8 AM.  Referral # [id number]     [address]     [place], [place]  [address]    [phone]         Patient denies SI/HI:   Yes,  Patient is no longer endorsing SI/HI or thoughts of self harm.    Safety Planning and Suicide Prevention discussed:  Yes,  Reviewed during aftercare group.  Wynn Banker 05/09/2012, 9:59 AM

## 2012-05-09 NOTE — [facility] Suicide Risk Assessment (Signed)
Suicide Risk Assessment  Discharge Assessment     Demographic Factors:  Caucasian and Low socioeconomic status, Female  Mental Status Per Nursing Assessment::   On Admission:  NA  Current Mental Status by Physician: Patient alert and oriented to 4.  Denies aH/Vh/SI/HI.  Loss Factors: Financial problems/change in socioeconomic status  Historical Factors: Family history of mental illness or substance abuse and Impulsivity  Risk Reduction Factors:   Positive coping skills or problem solving skills  Continued Clinical Symptoms:  Depression:   Recent sense of peace/wellbeing  Cognitive Features That Contribute To Risk:  Cognitively intact  Suicide Risk:  Minimal: No identifiable suicidal ideation.  Patients presenting with no risk factors but with morbid ruminations; may be classified as minimal risk based on the severity of the depressive symptoms  Discharge Diagnoses:   AXIS I:  Major Depression, Recurrent severe AXIS II:  Deferred AXIS III:   Past Medical History  Diagnosis Date  . Bipolar 1 disorder   . Borderline personality disorder   . Arthritis   . Cancer     cervical cancer in 1993   AXIS IV:  economic problems and other psychosocial or environmental problems AXIS V:  61-70 mild symptoms  Plan Of Care/Follow-up recommendations:  Activity:  as tolerated Diet:  regular Follow up with outpatient appointments.  Is patient on multiple antipsychotic therapies at discharge:  No   Has Patient had three or more failed trials of antipsychotic monotherapy by history:  No  Recommended Plan for Multiple Antipsychotic Therapies: NA  Julia Kulzer 05/09/2012, 10:03 AM

## 2012-05-09 NOTE — Tx Team (Signed)
Interdisciplinary Treatment Plan Update   Date Reviewed:  05/09/2012  Time Reviewed:  9:55 AM  Progress in Treatment:   Attending groups: Yes Participating in groups: Yes Taking medication as prescribed: Yes  Tolerating medication: Yes Family/Significant other contact made: No, patient is declining family contact.  Patient understands diagnosis: Yes  Discussing patient identified problems/goals with staff: Yes Medical problems stabilized or resolved: Yes Denies suicidal/homicidal ideation: Yes, she reports she is no longer endorsing SI or HI. Patient has not harmed self or others: Yes  For review of initial/current patient goals, please see plan of care.  Estimated Length of Stay: Discharge home today.  Reasons for Continued Hospitalization:   New Problems/Goals identified:    Discharge Plan or Barriers:   Home with outpatient follow up [facility]  Additional Comments:  Patient reports being much better today.  She shared her determination and self esteem has improved since being here and she looks forward to discharging home today.  Attendees:  Patient: Sandra Santos 05/09/2012 9:55 AM   Signature: Patrick North, MD 05/09/2012 9:55 AM  Signature:Westlynn Arlana Pouch, RN 05/09/2012 9:55 AM  Signature:Britney Chales Abrahams, RN 05/09/2012 9:55 AM  Signature: Tinnie Gens, RN 05/09/2012 9:55 AM  Signature:   05/09/2012 9:55 AM  Signature:  Juline Patch, LCSW 05/09/2012 9:55 AM  Signature:  05/09/2012 9:55 AM  Signature: Liliane Bade, BSW 05/09/2012 9:55 AM  Signature: Fransisca Kaufmann, [facility] 05/09/2012 9:55 AM  Signature:    Signature:    Signature:      Scribe for Treatment Team:   Juline Patch,  05/09/2012 9:55 AM

## 2012-05-09 NOTE — [facility] Suicide Risk Assessment (Signed)
[facility] INPATIENT:  Family/Significant Other Suicide Prevention Education  Suicide Prevention Education:  Patient Refusal for Family/Significant Other Suicide Prevention Education: The patient Sandra Santos has refused to provide written consent for family/significant other to be provided Family/Significant Other Suicide Prevention Education during admission and/or prior to discharge.  Physician notified.  Patient reports she has problems in her family and does not want to involve them in her treatment.  Wynn Banker 05/09/2012, 9:58 AM

## 2012-05-09 NOTE — Progress Notes (Signed)
Patient denies SI/HI, denies A/V hallucinations. Patient verbalizes understanding of discharge instructions, follow up care and prescriptions. Patient given all belongings from [facility] locker. Patient escorted out by staff, transported by taxi.  

## 2012-05-09 NOTE — Discharge Summary (Signed)
Physician Discharge Summary Note  Patient:  Sandra Santos is an 47 y.o., female MRN:  [id number] DOB:  1965-06-09 Patient phone:  [phone] (home)  Patient address:   [address] [place] [address],   Date of Admission:  05/06/2012 Date of Discharge: 05/09/12  Reason for Admission:  Bipolar Mania and Substance Abuse  Discharge Diagnoses: Active Problems:   * No active hospital problems. *  Review of Systems  Constitutional: Negative.   HENT: Negative.   Eyes: Negative.   Respiratory: Negative.   Cardiovascular: Negative.   Gastrointestinal: Negative.   Genitourinary: Negative.   Musculoskeletal: Negative.   Skin: Negative.   Neurological: Negative.   Endo/Heme/Allergies: Negative.   Psychiatric/Behavioral: Positive for substance abuse. Negative for depression, suicidal ideas, hallucinations and memory loss. The patient is nervous/anxious. The patient does not have insomnia.    Axis Diagnosis:   AXIS I:  Bipolar, mixed and Cocaine Abuse AXIS II:  Deferred AXIS III:   Past Medical History  Diagnosis Date  . Bipolar 1 disorder   . Borderline personality disorder   . Arthritis   . Cancer     cervical cancer in 1993   AXIS IV:  economic problems and other psychosocial or environmental problems AXIS V:  61-70 mild symptoms  Level of Care:  OP  Hospital Course:  Sandra Santos is an 47 y.o. Caucasian female with a long history of bipolar disorder. Patient came to the ED with complaints of auditory hallucinations and suicidal thoughts. She reported in the ED that "voices" tell her to kill herself. She continues to feel suicidal but does not have a plan. She has a long history of mental illness with several inpatient admissions. She is currently followed at [facility] by Dr Rosalia Hammers for medications. She is not taking her medications as prescribed. She also has begun to abuse cocaine and Xanax, She has been using for the last 3 days. Patient reports she has been stressed by conflict  with her children, evasive about the details.       The duration of stay was three days.The patient was seen and evaluated by the Treatment team consisting of Psychiatrist, NP-C, RN, Case Manager, and Therapist for evaluation and treatment plan with goal of stabilization upon discharge. The patient's physical and mental health problems were identified and treated appropriately. Patient was restless and agitated upon first meeting with treatment team. She was very cautious about revealing any details about her living situation.       Multiple modalities of treatment were used including medication, individual and group therapies, unit programming, improved nutrition, physical activity, and family sessions as needed. Patient had only been taking her lithium once daily prior to admission stating "I was not used to taking it twice a day." Her Trazodone 200 mg was continued upon admission. Her Lithium was restarted at 600 mg twice a day. She was also started on Zoloft 25 mg daily for depressive symptoms. Patient's mood greatly stabilized by the second day of her admission. Patient was sleeping better and her appetite had returned. Her lithium level was 0.87 on the morning of discharge which was in the therapeutic range.      The symptoms of mood instability were monitored daily by evaluation by clinical provider.  The patient's mental and emotional status was evaluated by a daily self inventory completed by the patient.      Improvement was demonstrated by declining numbers on the self assessment, improving vital signs, increased cognition, and improvement  in mood, sleep, appetite as well as a reduction in physical symptoms. Patient indicated a desire to reclaim her life by leaving her unstable living environment. She reported a desire to become medication complaint and reported that she would use a pill box and set an alarm. Also reported a strong desire to stop using cocaine and even to stop smoking. Patient felt  her environment contributed to her use as there were many triggers.      The patient was evaluated and found to be stable enough for discharge and was released to home per the initial plan of treatment. Patient spoke to treatment team prior to her discharge and denied SI/HI. She spoke about her future with optimism. Patient stated "My self confidence has come back. I know I am valuable. I will make a new life." Sandra Santos was deemed physically and mentally stable for discharge.   Mental Status Exam:  For mental status exam please see mental status exam and  suicide risk assessment completed by attending physician prior to discharge.     Consults:  None  Significant Diagnostic Studies:  labs: Chem profile, CBC stable on admission, UDS positive for cocaine  Discharge Vitals:   Blood pressure 117/82, pulse 63, temperature 97.1 F (36.2 C), temperature source Oral, resp. rate 20, height 5' 2.5" (1.588 m), weight 50.803 kg (112 lb). Body mass index is 20.15 kg/(m^2). Lab Results:   Results for orders placed during the hospital encounter of 05/06/12 (from the past 72 hour(s))  LITHIUM LEVEL     Status: None   Collection Time    05/09/12  6:12 AM      Result Value Range   Lithium Lvl 0.87  0.80 - 1.40 mEq/L    Physical Findings: AIMS: Facial and Oral Movements Muscles of Facial Expression: None, normal Lips and Perioral Area: None, normal Jaw: None, normal Tongue: None, normal,Extremity Movements Upper (arms, wrists, hands, fingers): None, normal Lower (legs, knees, ankles, toes): None, normal, Trunk Movements Neck, shoulders, hips: None, normal, Overall Severity Severity of abnormal movements (highest score from questions above): None, normal Incapacitation due to abnormal movements: None, normal Patient's awareness of abnormal movements (rate only patient's report): No Awareness,    CIWA:    COWS:     Psychiatric Specialty Exam: See Psychiatric Specialty Exam and Suicide Risk Assessment  completed by Attending Physician prior to discharge.  Discharge destination:  Home  Is patient on multiple antipsychotic therapies at discharge:  No   Has Patient had three or more failed trials of antipsychotic monotherapy by history:  No  Recommended Plan for Multiple Antipsychotic Therapies: N/A     Medication List    ASK your doctor about these medications     Indication   amphetamine-dextroamphetamine 15 MG tablet  Commonly known as:  ADDERALL  Take 15 mg by mouth 3 (three) times daily.      buprenorphine-naloxone 8-2 MG Subl  Commonly known as:  SUBOXONE  Place 1 tablet under the tongue 3 (three) times daily.      lithium carbonate 450 MG CR tablet  Commonly known as:  ESKALITH  Take 450 mg by mouth 2 (two) times daily.      traZODone 100 MG tablet  Commonly known as:  DESYREL  Take 200 mg by mouth at bedtime.      zolpidem 10 MG tablet  Commonly known as:  AMBIEN  Take 10 mg by mouth at bedtime.            Follow-up  Information   Follow up with [facility] On 05/13/2012. (You are scheduled with [facility] on Tuesday, May 13, 2012 at [facility] . Referral # [id number])    Contact information:   [address] [place], [place]  [address]  [phone]      Follow-up recommendations:  Activity:  Resume usual activities Diet:  Regular  Comments:   Take all your medications as prescribed by your mental healthcare provider.  Report any adverse effects and or reactions from your medicines to your outpatient provider promptly.  Patient is instructed and cautioned to not engage in alcohol and or illegal drug use while on prescription medicines.  In the event of worsening symptoms, patient is instructed to call the crisis hotline, 911 and or go to the nearest ED for appropriate evaluation and treatment of symptoms.  Follow-up with your primary care provider for your other medical issues, concerns and or health care needs.     Total Discharge Time:  Greater than 30  minutes.  SignedFransisca Kaufmann NP-C 05/09/2012, 1:29 PM

## 2012-05-09 NOTE — Progress Notes (Signed)
Adult Psychoeducational Group Note  Date:  05/09/2012 Time:  12:08 PM  Group Topic/Focus:  Dimensions of Wellness:   The focus of this group is to introduce the topic of wellness and discuss the role each dimension of wellness plays in total health.  Participation Level:  Active  Participation Quality:  Appropriate and Attentive  Affect:  Appropriate  Cognitive:  Alert and Appropriate  Insight: Good  Engagement in Group:  Engaged  Modes of Intervention:  Discussion, Education and Support  Additional Comments:  Pt goal is to go home with strength and power.   Krisa Blattner T 05/09/2012, 12:08 PM

## 2012-05-09 NOTE — [facility] Group Notes (Signed)
[facility] LCSW Aftercare Discharge Planning Group Note   05/09/2012 12:59 PM  Participation Quality:  Appropriate  Mood/Affect:  Appropriate  Depression Rating:  0  Anxiety Rating:  0  Thoughts of Suicide:  No  Will you contract for safety?   NA  Current AVH:  Yes  Plan for Discharge/Comments:  Patient reports being back on meds and feeling "fabulous."  She shared her self esteem has improved greatly since being in the hospital.  Transportation Means: Patient will need assistance with transportation home.  Supports:  Limited support system.  Sandra Santos, Joesph July

## 2012-05-14 NOTE — Progress Notes (Signed)
Patient Discharge Instructions:  After Visit Summary (AVS):   Faxed to:  05/14/12 Discharge Summary Note:   Faxed to:  05/14/12 Psychiatric Admission Assessment Note:   Faxed to:  05/14/12 Suicide Risk Assessment - Discharge Assessment:   Faxed to:  05/14/12 Faxed/Sent to the Next Level Care provider:  05/14/12 Faxed to [facility] @ [phone]  Jerelene Redden, 05/14/2012, 4:48 PM

## 2013-11-16 ENCOUNTER — Encounter (HOSPITAL_COMMUNITY): Payer: Self-pay | Admitting: *Deleted

## 2014-01-26 ENCOUNTER — Encounter (HOSPITAL_BASED_OUTPATIENT_CLINIC_OR_DEPARTMENT_OTHER): Payer: Self-pay | Admitting: *Deleted

## 2014-01-26 ENCOUNTER — Emergency Department (HOSPITAL_BASED_OUTPATIENT_CLINIC_OR_DEPARTMENT_OTHER)
Admission: EM | Admit: 2014-01-26 | Discharge: 2014-01-26 | Disposition: A | Discharge status: Home / Self Care | Payer: Medicaid Other | Attending: Emergency Medicine | Admitting: Emergency Medicine

## 2014-01-26 ENCOUNTER — Emergency Department (HOSPITAL_BASED_OUTPATIENT_CLINIC_OR_DEPARTMENT_OTHER): Payer: Medicaid Other

## 2014-01-26 DIAGNOSIS — M1990 Unspecified osteoarthritis, unspecified site: Secondary | ICD-10-CM | POA: Diagnosis not present

## 2014-01-26 DIAGNOSIS — Z8541 Personal history of malignant neoplasm of cervix uteri: Secondary | ICD-10-CM | POA: Insufficient documentation

## 2014-01-26 DIAGNOSIS — Y9389 Activity, other specified: Secondary | ICD-10-CM | POA: Diagnosis not present

## 2014-01-26 DIAGNOSIS — W01198A Fall on same level from slipping, tripping and stumbling with subsequent striking against other object, initial encounter: Secondary | ICD-10-CM | POA: Diagnosis not present

## 2014-01-26 DIAGNOSIS — Z720 Tobacco use: Secondary | ICD-10-CM | POA: Diagnosis not present

## 2014-01-26 DIAGNOSIS — S2241XA Multiple fractures of ribs, right side, initial encounter for closed fracture: Secondary | ICD-10-CM | POA: Insufficient documentation

## 2014-01-26 DIAGNOSIS — F419 Anxiety disorder, unspecified: Secondary | ICD-10-CM | POA: Diagnosis not present

## 2014-01-26 DIAGNOSIS — S3992XA Unspecified injury of lower back, initial encounter: Secondary | ICD-10-CM | POA: Insufficient documentation

## 2014-01-26 DIAGNOSIS — R0789 Other chest pain: Secondary | ICD-10-CM

## 2014-01-26 DIAGNOSIS — Y998 Other external cause status: Secondary | ICD-10-CM | POA: Diagnosis not present

## 2014-01-26 DIAGNOSIS — Z79899 Other long term (current) drug therapy: Secondary | ICD-10-CM | POA: Insufficient documentation

## 2014-01-26 DIAGNOSIS — F319 Bipolar disorder, unspecified: Secondary | ICD-10-CM | POA: Insufficient documentation

## 2014-01-26 DIAGNOSIS — Y92009 Unspecified place in unspecified non-institutional (private) residence as the place of occurrence of the external cause: Secondary | ICD-10-CM | POA: Insufficient documentation

## 2014-01-26 DIAGNOSIS — S2231XA Fracture of one rib, right side, initial encounter for closed fracture: Secondary | ICD-10-CM

## 2014-01-26 DIAGNOSIS — S299XXA Unspecified injury of thorax, initial encounter: Secondary | ICD-10-CM | POA: Diagnosis present

## 2014-01-26 HISTORY — DX: Anxiety disorder, unspecified: F41.9

## 2014-01-26 LAB — URINALYSIS, ROUTINE W REFLEX MICROSCOPIC
BILIRUBIN URINE: NEGATIVE
GLUCOSE, UA: NEGATIVE mg/dL
KETONES UR: NEGATIVE mg/dL
LEUKOCYTES UA: NEGATIVE
NITRITE: NEGATIVE
PROTEIN: NEGATIVE mg/dL
Specific Gravity, Urine: 1.023 (ref 1.005–1.030)
Urobilinogen, UA: 1 mg/dL (ref 0.0–1.0)
pH: 5.5 (ref 5.0–8.0)

## 2014-01-26 LAB — URINE MICROSCOPIC-ADD ON

## 2014-01-26 MED ORDER — OXYCODONE-ACETAMINOPHEN 5-325 MG PO TABS
2.0000 | ORAL_TABLET | Freq: Once | ORAL | Status: AC
  Administered 2014-01-26: 2 via ORAL
  Filled 2014-01-26: qty 2

## 2014-01-26 NOTE — Discharge Instructions (Signed)
You have rib fractures, but are also on suboxone, this will make treating your pain very difficult.  You should call your PCP ASAP to obtain pain medication and discuss your condition. Rib Fracture A rib fracture is a break or crack in one of the bones of the ribs. The ribs are a group of long, curved bones that wrap around your chest and attach to your spine. They protect your lungs and other organs in the chest cavity. A broken or cracked rib is often painful, but most do not cause other problems. Most rib fractures heal on their own over time. However, rib fractures can be more serious if multiple ribs are broken or if broken ribs move out of place and push against other structures. CAUSES   A direct blow to the chest. For example, this could happen during contact sports, a car accident, or a fall against a hard object.  Repetitive movements with high force, such as pitching a baseball or having severe coughing spells. SYMPTOMS   Pain when you breathe in or cough.  Pain when someone presses on the injured area. DIAGNOSIS  Your caregiver will perform a physical exam. Various imaging tests may be ordered to confirm the diagnosis and to look for related injuries. These tests may include a chest X-ray, computed tomography (CT), magnetic resonance imaging (MRI), or a bone scan. TREATMENT  Rib fractures usually heal on their own in 1-3 months. The longer healing period is often associated with a continued cough or other aggravating activities. During the healing period, pain control is very important. Medication is usually given to control pain. Hospitalization or surgery may be needed for more severe injuries, such as those in which multiple ribs are broken or the ribs have moved out of place.  HOME CARE INSTRUCTIONS   Avoid strenuous activity and any activities or movements that cause pain. Be careful during activities and avoid bumping the injured rib.  Gradually increase activity as directed by  your caregiver.  Only take over-the-counter or prescription medications as directed by your caregiver. Do not take other medications without asking your caregiver first.  Apply ice to the injured area for the first 1-2 days after you have been treated or as directed by your caregiver. Applying ice helps to reduce inflammation and pain.  Put ice in a plastic bag.  Place a towel between your skin and the bag.   Leave the ice on for 15-20 minutes at a time, every 2 hours while you are awake.  Perform deep breathing as directed by your caregiver. This will help prevent pneumonia, which is a common complication of a broken rib. Your caregiver may instruct you to:  Take deep breaths several times a day.  Try to cough several times a day, holding a pillow against the injured area.  Use a device called an incentive spirometer to practice deep breathing several times a day.  Drink enough fluids to keep your urine clear or pale yellow. This will help you avoid constipation.   Do not wear a rib belt or binder. These restrict breathing, which can lead to pneumonia.  SEEK IMMEDIATE MEDICAL CARE IF:   You have a fever.   You have difficulty breathing or shortness of breath.   You develop a continual cough, or you cough up thick or bloody sputum.  You feel sick to your stomach (nausea), throw up (vomit), or have abdominal pain.   You have worsening pain not controlled with medications.  MAKE SURE YOU:  Understand these instructions.  Will watch your condition.  Will get help right away if you are not doing well or get worse. Document Released: 01/01/2005 Document Revised: 09/03/2012 Document Reviewed: 03/05/2012 [facility] Patient Information 2015 [place], [place]. This information is not intended to replace advice given to you by your health care provider. Make sure you discuss any questions you have with your health care provider.

## 2014-01-26 NOTE — ED Notes (Signed)
Pt yelling and cursing at this RN stated she wanted narcotic prescription for pain. Dr Colin Rhein talking with patient.

## 2014-01-26 NOTE — ED Provider Notes (Signed)
CSN: [id number]     Arrival date & time 01/26/14  1157 History  This chart was scribed for Debby Freiberg, MD by Peyton Bottoms, ED Scribe. This patient was seen in room MH08/MH08 and the patient's care was started at 7:47 PM.   Chief Complaint  Patient presents with  . Back Pain   Patient is a 49 y.o. female presenting with back pain. The history is provided by the patient. No language interpreter was used.  Back Pain Location:  Lumbar spine Quality:  Aching Pain severity:  Moderate Onset quality:  Gradual Duration:  3 days Chronicity:  New Context: falling   Relieved by:  Nothing Worsened by:  Nothing tried Ineffective treatments:  None tried  HPI Comments: Sandra Santos is a 49 y.o. female who presents to the Emergency Department complaining of moderate lower back pain that began due to fall that occurred 3 days ago when patient fell backwards against her dresser. She reports more pain on right side of her back. She denies ecchymosis to affected area. She reports PMHx of arthritis in lower back, pinched nerve in back and muscle spasms in back, and broken ribs. She reports the pain feels similar to having a broken rib. She reports associated cough and states that it hurts to cough. She denies associated dysuria, numbness in between legs, numbness in either leg, new weakness or hematuria, fever, mid-spine tenderness.  Past Medical History  Diagnosis Date  . Bipolar 1 disorder   . Borderline personality disorder   . Arthritis   . Cancer     cervical cancer in 1993  . Anxiety    Past Surgical History  Procedure Laterality Date  . Tonsillectomy    . Abdominal hysterectomy    . Ovarian cyst removal    . Cesarean section     Family History  Problem Relation Age of Onset  . Cancer Mother   . Diabetes Mother   . Asthma Mother   . Hypertension Mother   . Heart attack Father   . COPD Father   . Hypertension Father   . Arthritis    . Lung disease     History  Substance Use  Topics  . Smoking status: Current Every Day Smoker -- 2.00 packs/day for 30 years    Types: Cigarettes  . Smokeless tobacco: Never Used  . Alcohol Use: Yes     Comment: until recently once/twice monthly but daily x3 wks   OB History    Gravida Para Term Preterm AB TAB SAB Ectopic Multiple Living   5 3 2 1 2 2    3      Review of Systems  Musculoskeletal: Positive for back pain.  All other systems reviewed and are negative.  Allergies  Review of patient's allergies indicates no known allergies.  Home Medications   Prior to Admission medications   Medication Sig Start Date End Date Taking? Authorizing Provider  diazepam (VALIUM) 10 MG tablet Take 10 mg by mouth every 6 (six) hours as needed for anxiety.   Yes Historical Provider, MD  zolpidem (AMBIEN) 5 MG tablet Take 5 mg by mouth at bedtime as needed for sleep.   Yes Historical Provider, MD  lithium carbonate (LITHOBID) 300 MG CR tablet Take 2 tablets (600 mg total) by mouth 2 (two) times daily. For mood control 05/09/12   Elmarie Shiley, NP  traZODone (DESYREL) 100 MG tablet Take 2 tablets (200 mg total) by mouth at bedtime. For sleep 05/09/12   Elmarie Shiley, NP  Triage Vitals: BP 128/83 mmHg  Pulse 60  Temp(Src) 98.2 F (36.8 C) (Oral)  Resp 16  Ht 5\' 2"  (1.575 m)  Wt 105 lb (47.628 kg)  BMI 19.20 kg/m2  SpO2 100%  Physical Exam  Constitutional: She is oriented to person, place, and time. She appears well-developed and well-nourished.  HENT:  Head: Normocephalic and atraumatic.  Right Ear: External ear normal.  Left Ear: External ear normal.  Eyes: Conjunctivae and EOM are normal. Pupils are equal, round, and reactive to light.  Neck: Normal range of motion. Neck supple.  Cardiovascular: Normal rate, regular rhythm, normal heart sounds and intact distal pulses.   Pulmonary/Chest: Effort normal and breath sounds normal.  Abdominal: Soft. Bowel sounds are normal. There is no tenderness.  Musculoskeletal: Normal range of  motion.  Neurological: She is alert and oriented to person, place, and time.  Skin: Skin is warm and dry.  Nursing note and vitals reviewed.  ED Course  Procedures (including critical care time)  DIAGNOSTIC STUDIES: Oxygen Saturation is 100% on RA, normal by my interpretation.    COORDINATION OF CARE: 7:51 PM- Discussed plans to order diagnostic imaging of ribs with chest and urinalysis. Pt advised of plan for treatment and pt agrees.  Labs Review Labs Reviewed  URINALYSIS, ROUTINE W REFLEX MICROSCOPIC - Abnormal; Notable for the following:    APPearance CLOUDY (*)    Hgb urine dipstick SMALL (*)    All other components within normal limits  URINE MICROSCOPIC-ADD ON - Abnormal; Notable for the following:    Bacteria, UA MANY (*)    All other components within normal limits    Imaging Review Dg Ribs Unilateral W/chest Right  01/26/2014   CLINICAL DATA:  Acute right lower rib pain after fall yesterday.  EXAM: RIGHT RIBS AND CHEST - 3+ VIEW  COMPARISON:  April 25, 2003.  FINDINGS: Minimally displaced fractures are seen involving the lateral portions of the right ninth and tenth ribs. There is no evidence of pneumothorax or pleural effusion. Right lung is clear. Ill-defined density is noted in lingular region of left upper lobe. Heart size and mediastinal contours are within normal limits.  IMPRESSION: Minimally displaced right ninth and tenth rib fractures are noted.  Ill-defined density is noted in lingular region of left upper lobe which may simply represent confluence of overlying densities. However, focal nodule or inflammation cannot be excluded. Followup radiograph in 7-10 days is recommended to ensure resolution.   Electronically Signed   By: Sabino Dick M.D.   On: 01/26/2014 20:28     EKG Interpretation None     MDM   Final diagnoses:  Right-sided chest wall pain  Right rib fracture, closed, initial encounter    49 y.o. female with pertinent PMH of chronic back pain on  suboxone (although this was not relayed to any staff and was noted on provider database review) presents with R chest wall pain after fall 3 days ago.  No fevers, cough.  She has dyspnea related to splinting.  Physical exam as above with R chest wall pain.  XR with r sided rib fractures.  No blood on UA.  Will have pt fu with chronic pain specialist for further management of active pain.  I have reviewed all laboratory and imaging studies if ordered as above  1. Right rib fracture, closed, initial encounter   2. Right-sided chest wall pain       I personally performed the services described in this documentation, which was scribed  in my presence. The recorded information has been reviewed and is accurate.  Debby Freiberg, MD 01/27/14 [phone]

## 2014-01-26 NOTE — ED Notes (Signed)
Pt c/o lower back pain after fall onto dresser x 3 days ago

## 2014-05-12 IMAGING — CR DG ELBOW COMPLETE 3+V*L*
4 series · 4 of 4 positions shown · non-contrast
Comparison: None.

CLINICAL DATA: Left elbow pain status post fall.

LEFT ELBOW - COMPLETE 3+ VIEW

[view not recorded (1 of 4)]
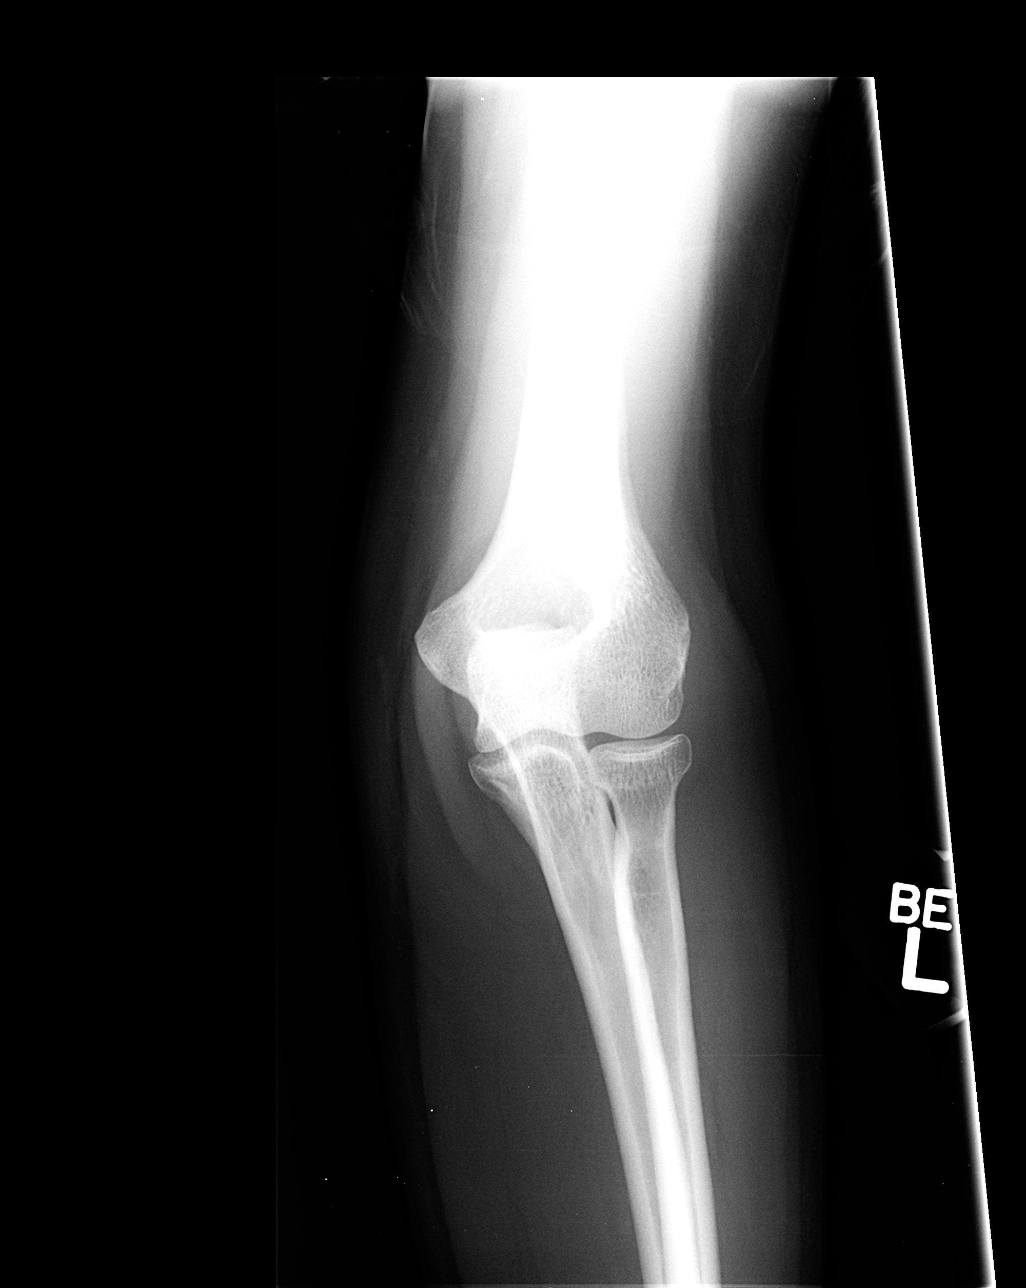

[view not recorded (2 of 4)]
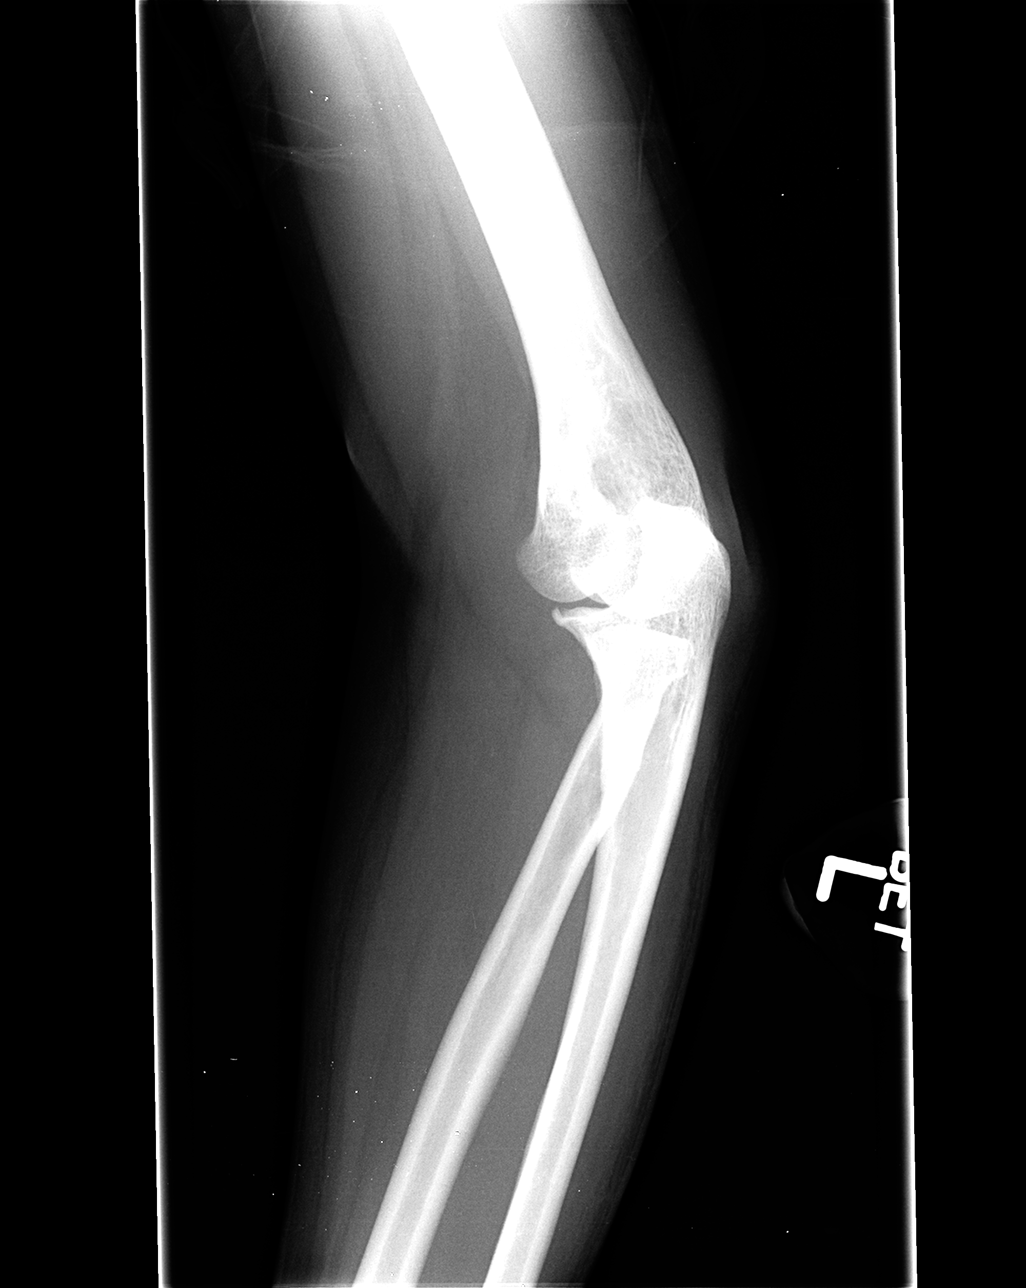

[view not recorded (3 of 4)]
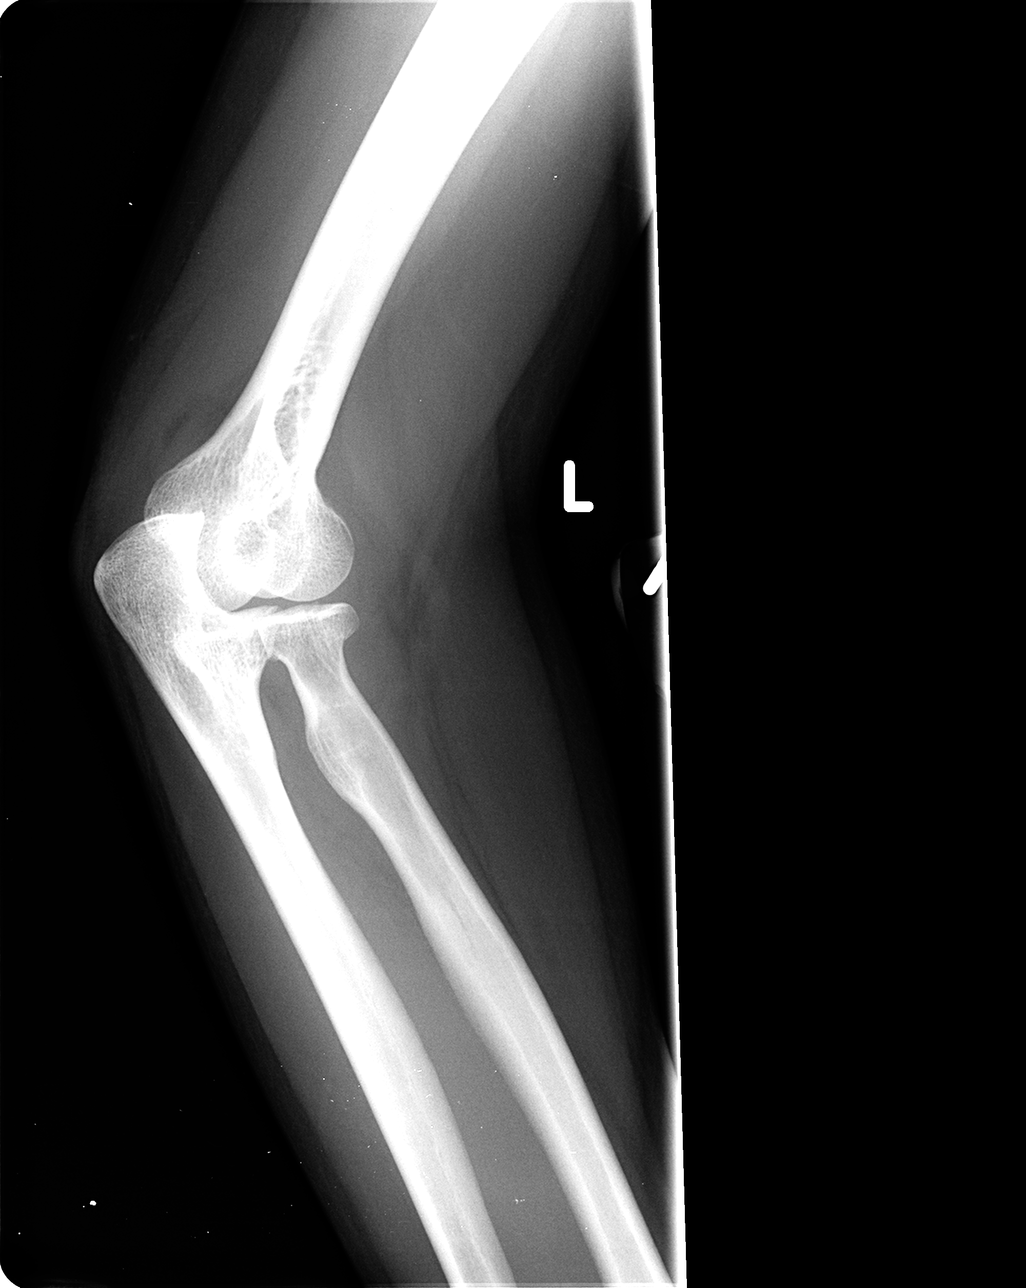

[view not recorded (4 of 4)]
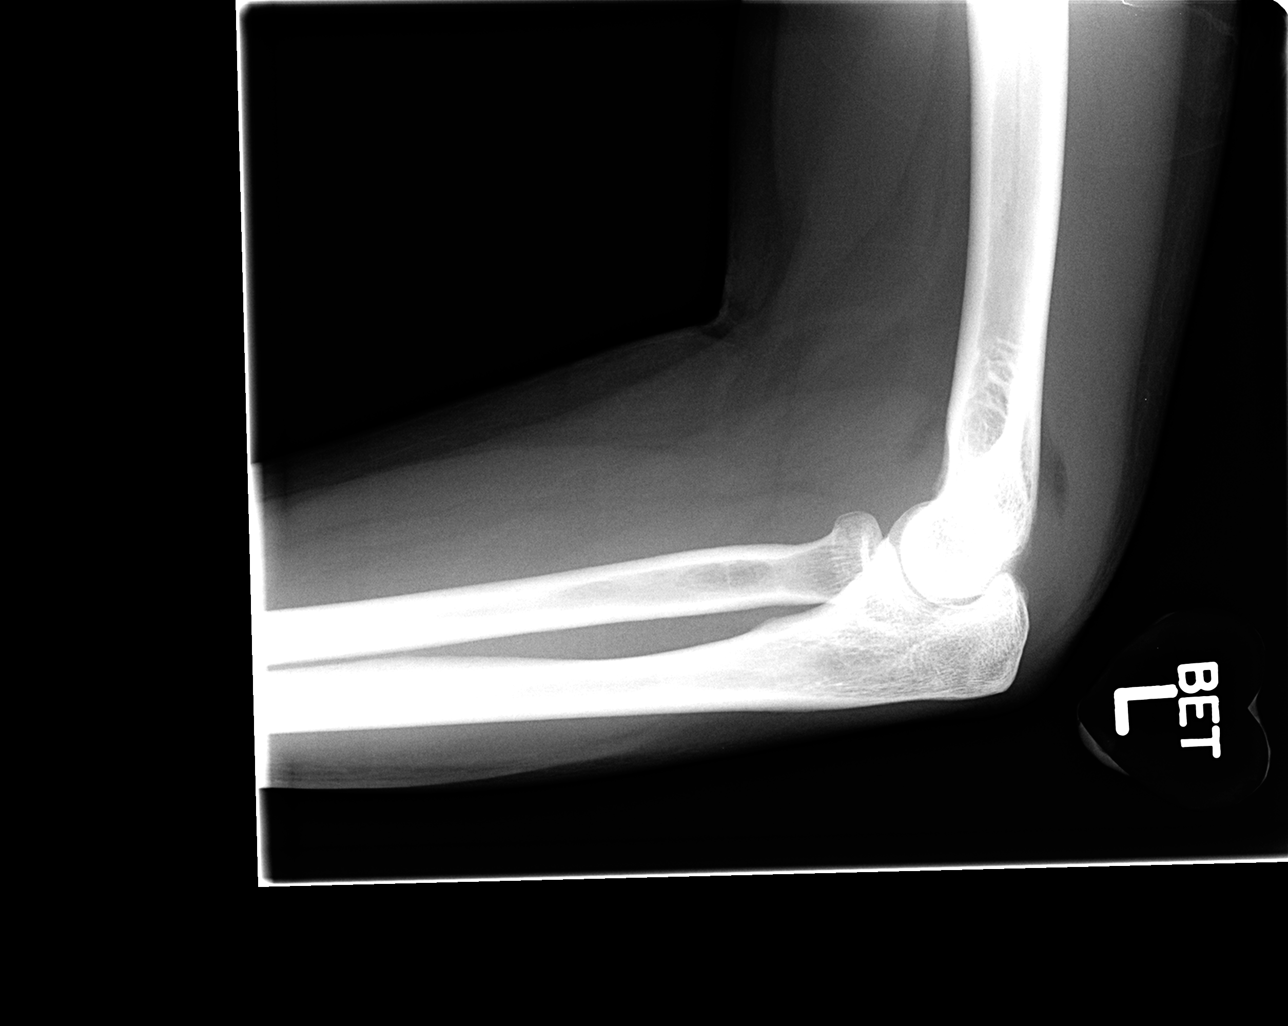

[4 of 4 positions shown; findings below may reference images not displayed]

FINDINGS: Minimally-displaced radial head fracture.  Large joint
effusion.  No dislocation. Mild enthesopathic versus degenerative
change about the coronoid process.  No aggressive osseous lesion.
IMPRESSION: Minimally-displaced left radial head fracture.

Large joint effusion.

## 2016-08-23 IMAGING — CR DG RIBS W/ CHEST 3+V*R*
3 series · 3 of 3 positions shown · non-contrast
Comparison: April 25, 2003.

CLINICAL DATA: Acute right lower rib pain after fall yesterday.

EXAM:
RIGHT RIBS AND CHEST - 3+ VIEW

[w chest pa]
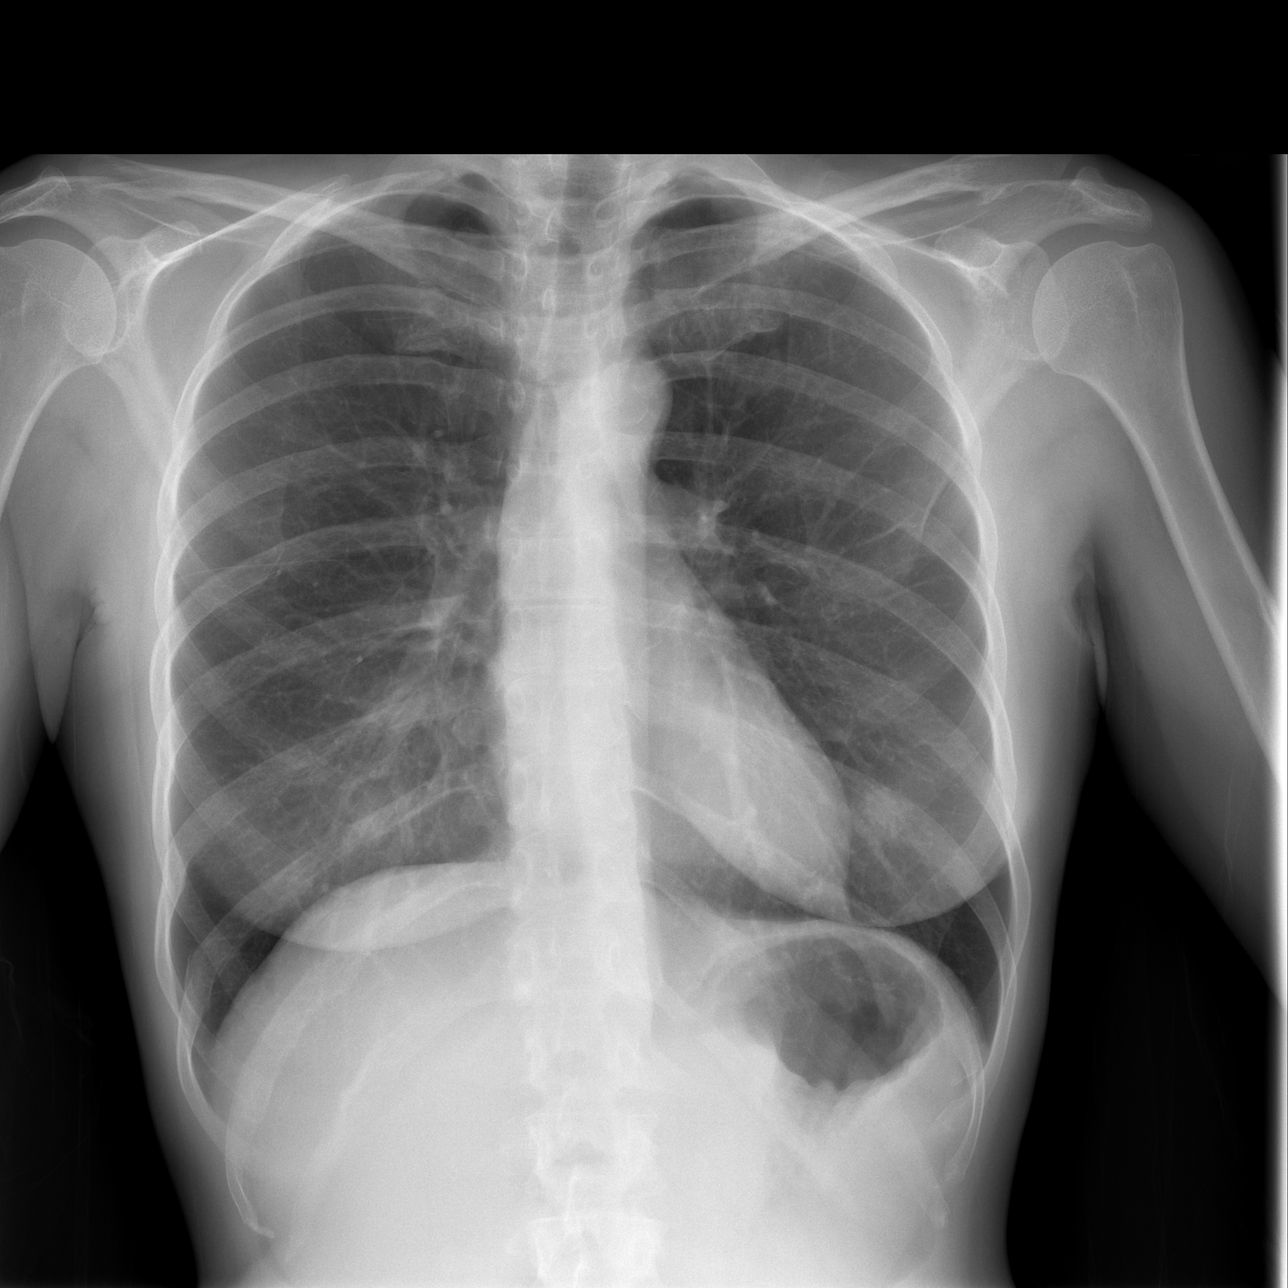

[w ribs ap/pa lower right]
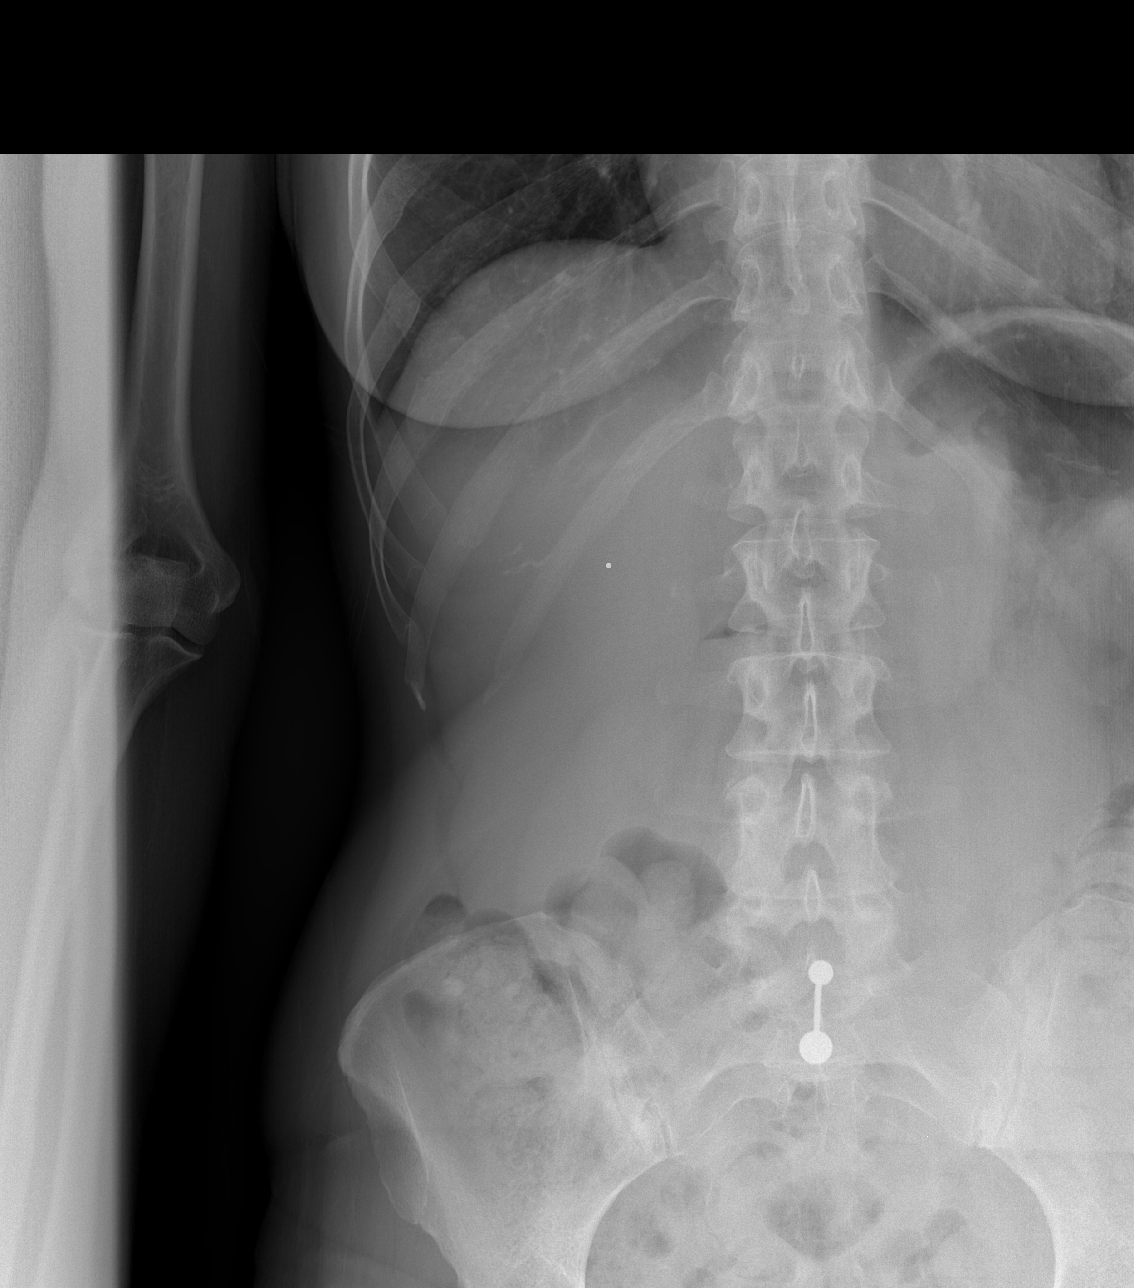

[w ribs oblique right]
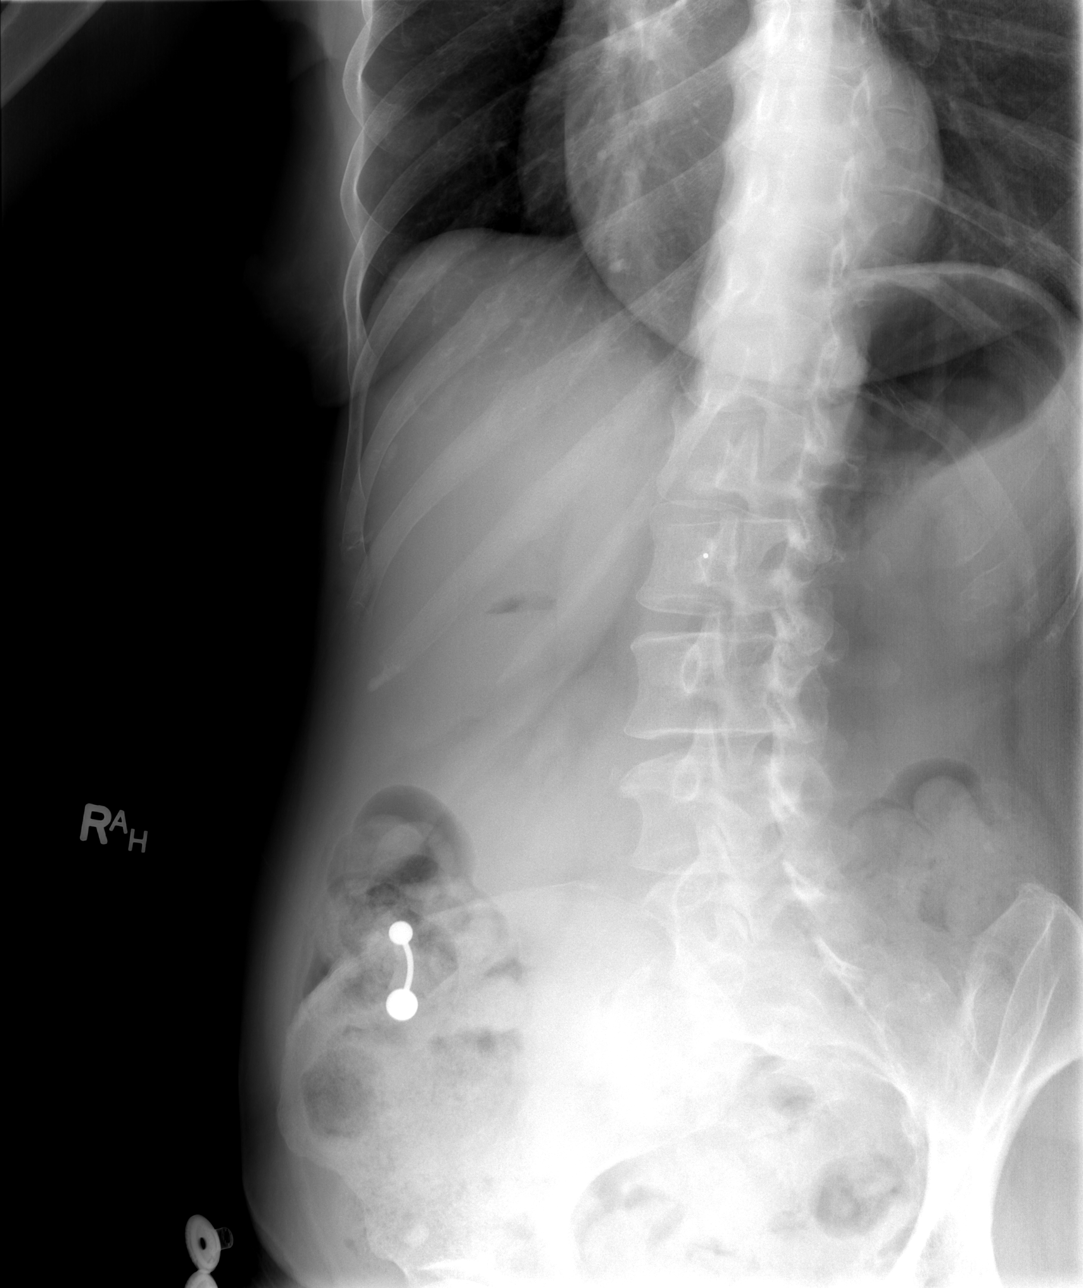

[3 of 3 positions shown; findings below may reference images not displayed]

FINDINGS: Minimally displaced fractures are seen involving the lateral
portions of the right ninth and tenth ribs. There is no evidence of
pneumothorax or pleural effusion. Right lung is clear. Ill-defined
density is noted in lingular region of left upper lobe. Heart size
and mediastinal contours are within normal limits.
IMPRESSION: Minimally displaced right ninth and tenth rib fractures are noted.

Ill-defined density is noted in lingular region of left upper lobe
which may simply represent confluence of overlying densities.
However, focal nodule or inflammation cannot be excluded. Followup
radiograph in 7-10 days is recommended to ensure resolution.
